# Patient Record
Sex: Female | Born: 1950 | Race: White | Hispanic: No | Marital: Married | Smoking: Never smoker
Health system: Southern US, Community
[De-identification: ages and names within clinical notes are randomized; demographics above are authoritative.]

## PROBLEM LIST (undated history)

## (undated) DIAGNOSIS — G40909 Epilepsy, unspecified, not intractable, without status epilepticus: Secondary | ICD-10-CM

## (undated) DIAGNOSIS — D649 Anemia, unspecified: Secondary | ICD-10-CM

## (undated) DIAGNOSIS — R4701 Aphasia: Secondary | ICD-10-CM

## (undated) DIAGNOSIS — R32 Unspecified urinary incontinence: Secondary | ICD-10-CM

## (undated) DIAGNOSIS — G8190 Hemiplegia, unspecified affecting unspecified side: Secondary | ICD-10-CM

## (undated) DIAGNOSIS — E039 Hypothyroidism, unspecified: Secondary | ICD-10-CM

## (undated) DIAGNOSIS — F32A Depression, unspecified: Secondary | ICD-10-CM

## (undated) DIAGNOSIS — E785 Hyperlipidemia, unspecified: Secondary | ICD-10-CM

## (undated) DIAGNOSIS — F0390 Unspecified dementia without behavioral disturbance: Secondary | ICD-10-CM

## (undated) DIAGNOSIS — E079 Disorder of thyroid, unspecified: Secondary | ICD-10-CM

## (undated) DIAGNOSIS — I10 Essential (primary) hypertension: Secondary | ICD-10-CM

## (undated) DIAGNOSIS — I639 Cerebral infarction, unspecified: Secondary | ICD-10-CM

## (undated) HISTORY — PX: THYROID SURGERY: SHX805

---

## 2013-09-22 DIAGNOSIS — K5090 Crohn's disease, unspecified, without complications: Secondary | ICD-10-CM | POA: Diagnosis not present

## 2013-09-22 DIAGNOSIS — N183 Chronic kidney disease, stage 3 unspecified: Secondary | ICD-10-CM | POA: Diagnosis not present

## 2013-09-22 DIAGNOSIS — E039 Hypothyroidism, unspecified: Secondary | ICD-10-CM | POA: Diagnosis not present

## 2013-12-29 DIAGNOSIS — K5010 Crohn's disease of large intestine without complications: Secondary | ICD-10-CM | POA: Diagnosis not present

## 2014-01-22 DIAGNOSIS — Z86010 Personal history of colonic polyps: Secondary | ICD-10-CM | POA: Diagnosis not present

## 2014-01-22 DIAGNOSIS — K5010 Crohn's disease of large intestine without complications: Secondary | ICD-10-CM | POA: Diagnosis not present

## 2014-02-02 DIAGNOSIS — E039 Hypothyroidism, unspecified: Secondary | ICD-10-CM | POA: Diagnosis not present

## 2014-02-02 DIAGNOSIS — Z79899 Other long term (current) drug therapy: Secondary | ICD-10-CM | POA: Diagnosis not present

## 2014-02-02 DIAGNOSIS — Z8673 Personal history of transient ischemic attack (TIA), and cerebral infarction without residual deficits: Secondary | ICD-10-CM | POA: Diagnosis not present

## 2014-02-02 DIAGNOSIS — N183 Chronic kidney disease, stage 3 unspecified: Secondary | ICD-10-CM | POA: Diagnosis not present

## 2014-02-02 DIAGNOSIS — F341 Dysthymic disorder: Secondary | ICD-10-CM | POA: Diagnosis not present

## 2014-02-02 DIAGNOSIS — E785 Hyperlipidemia, unspecified: Secondary | ICD-10-CM | POA: Diagnosis not present

## 2014-02-11 DIAGNOSIS — Z1231 Encounter for screening mammogram for malignant neoplasm of breast: Secondary | ICD-10-CM | POA: Diagnosis not present

## 2014-03-01 DIAGNOSIS — R748 Abnormal levels of other serum enzymes: Secondary | ICD-10-CM | POA: Diagnosis not present

## 2014-03-23 DIAGNOSIS — R748 Abnormal levels of other serum enzymes: Secondary | ICD-10-CM | POA: Diagnosis not present

## 2014-03-31 DIAGNOSIS — R748 Abnormal levels of other serum enzymes: Secondary | ICD-10-CM | POA: Diagnosis not present

## 2014-04-03 DIAGNOSIS — R748 Abnormal levels of other serum enzymes: Secondary | ICD-10-CM | POA: Diagnosis not present

## 2014-04-03 DIAGNOSIS — K8020 Calculus of gallbladder without cholecystitis without obstruction: Secondary | ICD-10-CM | POA: Diagnosis not present

## 2014-04-08 DIAGNOSIS — Z8673 Personal history of transient ischemic attack (TIA), and cerebral infarction without residual deficits: Secondary | ICD-10-CM | POA: Diagnosis not present

## 2014-04-08 DIAGNOSIS — K5090 Crohn's disease, unspecified, without complications: Secondary | ICD-10-CM | POA: Diagnosis not present

## 2014-04-08 DIAGNOSIS — E785 Hyperlipidemia, unspecified: Secondary | ICD-10-CM | POA: Diagnosis not present

## 2014-04-08 DIAGNOSIS — R748 Abnormal levels of other serum enzymes: Secondary | ICD-10-CM | POA: Diagnosis not present

## 2014-05-07 DIAGNOSIS — E785 Hyperlipidemia, unspecified: Secondary | ICD-10-CM | POA: Diagnosis not present

## 2014-05-07 DIAGNOSIS — R748 Abnormal levels of other serum enzymes: Secondary | ICD-10-CM | POA: Diagnosis not present

## 2014-07-01 DIAGNOSIS — H1130 Conjunctival hemorrhage, unspecified eye: Secondary | ICD-10-CM | POA: Diagnosis not present

## 2014-07-01 DIAGNOSIS — H1132 Conjunctival hemorrhage, left eye: Secondary | ICD-10-CM | POA: Diagnosis not present

## 2014-07-23 DIAGNOSIS — I6930 Unspecified sequelae of cerebral infarction: Secondary | ICD-10-CM | POA: Diagnosis not present

## 2014-07-23 DIAGNOSIS — Z79899 Other long term (current) drug therapy: Secondary | ICD-10-CM | POA: Diagnosis not present

## 2014-07-23 DIAGNOSIS — E039 Hypothyroidism, unspecified: Secondary | ICD-10-CM | POA: Diagnosis not present

## 2014-07-23 DIAGNOSIS — E780 Pure hypercholesterolemia: Secondary | ICD-10-CM | POA: Diagnosis not present

## 2014-08-23 DIAGNOSIS — G40209 Localization-related (focal) (partial) symptomatic epilepsy and epileptic syndromes with complex partial seizures, not intractable, without status epilepticus: Secondary | ICD-10-CM | POA: Diagnosis not present

## 2014-09-24 DIAGNOSIS — L02439 Carbuncle of limb, unspecified: Secondary | ICD-10-CM | POA: Diagnosis not present

## 2014-09-27 DIAGNOSIS — Z6831 Body mass index (BMI) 31.0-31.9, adult: Secondary | ICD-10-CM | POA: Diagnosis not present

## 2014-09-27 DIAGNOSIS — L02415 Cutaneous abscess of right lower limb: Secondary | ICD-10-CM | POA: Diagnosis not present

## 2014-09-27 DIAGNOSIS — E669 Obesity, unspecified: Secondary | ICD-10-CM | POA: Diagnosis not present

## 2014-11-01 DIAGNOSIS — L720 Epidermal cyst: Secondary | ICD-10-CM | POA: Diagnosis not present

## 2014-11-01 DIAGNOSIS — L723 Sebaceous cyst: Secondary | ICD-10-CM | POA: Diagnosis not present

## 2014-11-15 DIAGNOSIS — L723 Sebaceous cyst: Secondary | ICD-10-CM | POA: Diagnosis not present

## 2015-03-11 DIAGNOSIS — K5010 Crohn's disease of large intestine without complications: Secondary | ICD-10-CM | POA: Diagnosis not present

## 2015-03-11 DIAGNOSIS — I6930 Unspecified sequelae of cerebral infarction: Secondary | ICD-10-CM | POA: Diagnosis not present

## 2015-03-11 DIAGNOSIS — Z79899 Other long term (current) drug therapy: Secondary | ICD-10-CM | POA: Diagnosis not present

## 2015-03-11 DIAGNOSIS — E039 Hypothyroidism, unspecified: Secondary | ICD-10-CM | POA: Diagnosis not present

## 2015-03-11 DIAGNOSIS — E780 Pure hypercholesterolemia: Secondary | ICD-10-CM | POA: Diagnosis not present

## 2015-08-11 DIAGNOSIS — G40209 Localization-related (focal) (partial) symptomatic epilepsy and epileptic syndromes with complex partial seizures, not intractable, without status epilepticus: Secondary | ICD-10-CM | POA: Diagnosis not present

## 2015-10-07 DIAGNOSIS — Z79899 Other long term (current) drug therapy: Secondary | ICD-10-CM | POA: Diagnosis not present

## 2015-10-07 DIAGNOSIS — E785 Hyperlipidemia, unspecified: Secondary | ICD-10-CM | POA: Diagnosis not present

## 2015-10-07 DIAGNOSIS — Z9181 History of falling: Secondary | ICD-10-CM | POA: Diagnosis not present

## 2015-10-07 DIAGNOSIS — E039 Hypothyroidism, unspecified: Secondary | ICD-10-CM | POA: Diagnosis not present

## 2016-04-05 DIAGNOSIS — E785 Hyperlipidemia, unspecified: Secondary | ICD-10-CM | POA: Diagnosis not present

## 2016-04-05 DIAGNOSIS — E039 Hypothyroidism, unspecified: Secondary | ICD-10-CM | POA: Diagnosis not present

## 2016-04-05 DIAGNOSIS — K5090 Crohn's disease, unspecified, without complications: Secondary | ICD-10-CM | POA: Diagnosis not present

## 2016-04-05 DIAGNOSIS — I6930 Unspecified sequelae of cerebral infarction: Secondary | ICD-10-CM | POA: Diagnosis not present

## 2016-04-05 DIAGNOSIS — Z79899 Other long term (current) drug therapy: Secondary | ICD-10-CM | POA: Diagnosis not present

## 2016-08-06 DIAGNOSIS — G40209 Localization-related (focal) (partial) symptomatic epilepsy and epileptic syndromes with complex partial seizures, not intractable, without status epilepticus: Secondary | ICD-10-CM | POA: Diagnosis not present

## 2017-02-01 DIAGNOSIS — K5090 Crohn's disease, unspecified, without complications: Secondary | ICD-10-CM | POA: Diagnosis not present

## 2017-02-01 DIAGNOSIS — E785 Hyperlipidemia, unspecified: Secondary | ICD-10-CM | POA: Diagnosis not present

## 2017-02-01 DIAGNOSIS — Z79899 Other long term (current) drug therapy: Secondary | ICD-10-CM | POA: Diagnosis not present

## 2017-02-01 DIAGNOSIS — E039 Hypothyroidism, unspecified: Secondary | ICD-10-CM | POA: Diagnosis not present

## 2017-02-01 DIAGNOSIS — I6930 Unspecified sequelae of cerebral infarction: Secondary | ICD-10-CM | POA: Diagnosis not present

## 2017-02-14 DIAGNOSIS — Z111 Encounter for screening for respiratory tuberculosis: Secondary | ICD-10-CM | POA: Diagnosis not present

## 2017-02-14 DIAGNOSIS — Z23 Encounter for immunization: Secondary | ICD-10-CM | POA: Diagnosis not present

## 2017-02-14 DIAGNOSIS — Z209 Contact with and (suspected) exposure to unspecified communicable disease: Secondary | ICD-10-CM | POA: Diagnosis not present

## 2017-02-28 DIAGNOSIS — I6930 Unspecified sequelae of cerebral infarction: Secondary | ICD-10-CM | POA: Diagnosis not present

## 2017-07-15 DIAGNOSIS — H02831 Dermatochalasis of right upper eyelid: Secondary | ICD-10-CM | POA: Diagnosis not present

## 2017-07-15 DIAGNOSIS — R4701 Aphasia: Secondary | ICD-10-CM | POA: Diagnosis not present

## 2017-07-15 DIAGNOSIS — H2513 Age-related nuclear cataract, bilateral: Secondary | ICD-10-CM | POA: Diagnosis not present

## 2017-07-15 DIAGNOSIS — H47012 Ischemic optic neuropathy, left eye: Secondary | ICD-10-CM | POA: Diagnosis not present

## 2017-08-17 DIAGNOSIS — L03011 Cellulitis of right finger: Secondary | ICD-10-CM | POA: Diagnosis not present

## 2017-10-08 DIAGNOSIS — E785 Hyperlipidemia, unspecified: Secondary | ICD-10-CM | POA: Diagnosis not present

## 2017-10-08 DIAGNOSIS — E039 Hypothyroidism, unspecified: Secondary | ICD-10-CM | POA: Diagnosis not present

## 2017-10-08 DIAGNOSIS — Z0000 Encounter for general adult medical examination without abnormal findings: Secondary | ICD-10-CM | POA: Diagnosis not present

## 2017-10-08 DIAGNOSIS — Z8673 Personal history of transient ischemic attack (TIA), and cerebral infarction without residual deficits: Secondary | ICD-10-CM | POA: Diagnosis not present

## 2017-10-08 DIAGNOSIS — G40209 Localization-related (focal) (partial) symptomatic epilepsy and epileptic syndromes with complex partial seizures, not intractable, without status epilepticus: Secondary | ICD-10-CM | POA: Diagnosis not present

## 2017-10-08 DIAGNOSIS — Z79899 Other long term (current) drug therapy: Secondary | ICD-10-CM | POA: Diagnosis not present

## 2017-12-20 DIAGNOSIS — G40209 Localization-related (focal) (partial) symptomatic epilepsy and epileptic syndromes with complex partial seizures, not intractable, without status epilepticus: Secondary | ICD-10-CM | POA: Diagnosis not present

## 2017-12-20 DIAGNOSIS — M7051 Other bursitis of knee, right knee: Secondary | ICD-10-CM | POA: Diagnosis not present

## 2017-12-20 DIAGNOSIS — I69359 Hemiplegia and hemiparesis following cerebral infarction affecting unspecified side: Secondary | ICD-10-CM | POA: Diagnosis not present

## 2017-12-20 DIAGNOSIS — M7052 Other bursitis of knee, left knee: Secondary | ICD-10-CM | POA: Diagnosis not present

## 2018-02-22 DIAGNOSIS — L089 Local infection of the skin and subcutaneous tissue, unspecified: Secondary | ICD-10-CM | POA: Diagnosis not present

## 2018-03-31 DIAGNOSIS — K5000 Crohn's disease of small intestine without complications: Secondary | ICD-10-CM | POA: Diagnosis not present

## 2018-04-11 DIAGNOSIS — Z79899 Other long term (current) drug therapy: Secondary | ICD-10-CM | POA: Diagnosis not present

## 2018-04-11 DIAGNOSIS — Z6830 Body mass index (BMI) 30.0-30.9, adult: Secondary | ICD-10-CM | POA: Diagnosis not present

## 2018-04-11 DIAGNOSIS — Z9181 History of falling: Secondary | ICD-10-CM | POA: Diagnosis not present

## 2018-04-11 DIAGNOSIS — Z1331 Encounter for screening for depression: Secondary | ICD-10-CM | POA: Diagnosis not present

## 2018-04-11 DIAGNOSIS — E039 Hypothyroidism, unspecified: Secondary | ICD-10-CM | POA: Diagnosis not present

## 2018-07-11 DIAGNOSIS — S61419A Laceration without foreign body of unspecified hand, initial encounter: Secondary | ICD-10-CM | POA: Diagnosis not present

## 2018-07-11 DIAGNOSIS — Z6832 Body mass index (BMI) 32.0-32.9, adult: Secondary | ICD-10-CM | POA: Diagnosis not present

## 2018-08-11 ENCOUNTER — Emergency Department (HOSPITAL_BASED_OUTPATIENT_CLINIC_OR_DEPARTMENT_OTHER): Payer: Medicare Other

## 2018-08-11 ENCOUNTER — Other Ambulatory Visit: Payer: Self-pay

## 2018-08-11 ENCOUNTER — Encounter (HOSPITAL_BASED_OUTPATIENT_CLINIC_OR_DEPARTMENT_OTHER): Payer: Self-pay | Admitting: *Deleted

## 2018-08-11 ENCOUNTER — Emergency Department (HOSPITAL_BASED_OUTPATIENT_CLINIC_OR_DEPARTMENT_OTHER)
Admission: EM | Admit: 2018-08-11 | Discharge: 2018-08-11 | Disposition: A | Discharge status: Home / Self Care | Payer: Medicare Other | Attending: Emergency Medicine | Admitting: Emergency Medicine

## 2018-08-11 DIAGNOSIS — Y929 Unspecified place or not applicable: Secondary | ICD-10-CM | POA: Insufficient documentation

## 2018-08-11 DIAGNOSIS — Z79899 Other long term (current) drug therapy: Secondary | ICD-10-CM | POA: Insufficient documentation

## 2018-08-11 DIAGNOSIS — Y9389 Activity, other specified: Secondary | ICD-10-CM | POA: Insufficient documentation

## 2018-08-11 DIAGNOSIS — Z87891 Personal history of nicotine dependence: Secondary | ICD-10-CM | POA: Insufficient documentation

## 2018-08-11 DIAGNOSIS — Z7902 Long term (current) use of antithrombotics/antiplatelets: Secondary | ICD-10-CM | POA: Diagnosis not present

## 2018-08-11 DIAGNOSIS — S199XXA Unspecified injury of neck, initial encounter: Secondary | ICD-10-CM | POA: Diagnosis not present

## 2018-08-11 DIAGNOSIS — Z7982 Long term (current) use of aspirin: Secondary | ICD-10-CM | POA: Diagnosis not present

## 2018-08-11 DIAGNOSIS — I10 Essential (primary) hypertension: Secondary | ICD-10-CM | POA: Diagnosis not present

## 2018-08-11 DIAGNOSIS — Y998 Other external cause status: Secondary | ICD-10-CM | POA: Diagnosis not present

## 2018-08-11 DIAGNOSIS — S0990XA Unspecified injury of head, initial encounter: Secondary | ICD-10-CM | POA: Diagnosis not present

## 2018-08-11 DIAGNOSIS — Z8673 Personal history of transient ischemic attack (TIA), and cerebral infarction without residual deficits: Secondary | ICD-10-CM | POA: Insufficient documentation

## 2018-08-11 DIAGNOSIS — M79601 Pain in right arm: Secondary | ICD-10-CM | POA: Diagnosis not present

## 2018-08-11 DIAGNOSIS — S5011XA Contusion of right forearm, initial encounter: Secondary | ICD-10-CM | POA: Diagnosis not present

## 2018-08-11 DIAGNOSIS — S4991XA Unspecified injury of right shoulder and upper arm, initial encounter: Secondary | ICD-10-CM | POA: Diagnosis not present

## 2018-08-11 DIAGNOSIS — W0110XA Fall on same level from slipping, tripping and stumbling with subsequent striking against unspecified object, initial encounter: Secondary | ICD-10-CM | POA: Diagnosis not present

## 2018-08-11 DIAGNOSIS — W19XXXA Unspecified fall, initial encounter: Secondary | ICD-10-CM

## 2018-08-11 HISTORY — DX: Essential (primary) hypertension: I10

## 2018-08-11 HISTORY — DX: Cerebral infarction, unspecified: I63.9

## 2018-08-11 HISTORY — DX: Disorder of thyroid, unspecified: E07.9

## 2018-08-11 NOTE — ED Triage Notes (Signed)
Hx of stroke 2008. She had an unwitnessed fall tonight. She has a skin tear and hematoma to her right forearm. She has a hematoma to her left scalp. She is on blood thinners.

## 2018-08-11 NOTE — Discharge Instructions (Signed)
Please read and follow all provided instructions.  Your diagnoses today include:  1. Fall, initial encounter   2. Minor head injury, initial encounter   3. Traumatic hematoma of right forearm, initial encounter     Tests performed today include:  CT scan of your head that did not show any serious injury.  X-ray of the R arm did not show any broken bones -- shows hematoma  Vital signs. See below for your results today.   Medications prescribed:   None  Take any prescribed medications only as directed.  Home care instructions:  Follow any educational materials contained in this packet.  Follow-up instructions: Please follow-up with your primary care provider in the next 3 days for further evaluation of your symptoms.   Return instructions:  SEEK IMMEDIATE MEDICAL ATTENTION IF:  There is confusion or drowsiness (although children frequently become drowsy after injury).   You cannot awaken the injured person.   You have more than one episode of vomiting.   You notice dizziness or unsteadiness which is getting worse, or inability to walk.   You have convulsions or unconsciousness.   You experience severe, persistent headaches not relieved by Tylenol.  You cannot use arms or legs normally.   There are changes in pupil sizes. (This is the black center in the colored part of the eye)   There is clear or bloody discharge from the nose or ears.   You have change in speech, vision, swallowing, or understanding.   Localized weakness, numbness, tingling, or change in bowel or bladder control.  You have any other emergent concerns.  Additional Information: You have had a head injury which does not appear to require admission at this time.  Your vital signs today were: BP (!) 171/77    Pulse 64    Temp (!) 97.5 F (36.4 C) (Oral)    Resp 18    Ht 5\' 4"  (1.626 m)    Wt 93 kg    SpO2 100%    BMI 35.19 kg/m  If your blood pressure (BP) was elevated above 135/85 this visit,  please have this repeated by your doctor within one month. --------------

## 2018-08-11 NOTE — ED Notes (Signed)
Patient transported to CT 

## 2018-08-11 NOTE — ED Provider Notes (Addendum)
[facility] EMERGENCY DEPARTMENT Provider Note   CSN: [id number] Arrival date & time: 08/11/18  1941     History   Chief Complaint Chief Complaint  Patient presents with  . Fall    HPI Suzanne Barnett is a 67 y.o. female.  Patient who is a nursing home resident, nonverbal due to history of stroke, on Plavix and aspirin --presents the emergency department.  Patient had an unwitnessed fall tonight at approximately 6:30 PM.  Patient was able to crawl and call for help after her fall.  Patient developed a large hematoma over her right forearm.  She hit her head and sustained a scalp hematoma.  No reported neck pain.  No chest or abdominal pain.  Patient normally walks with a quad cane and has been on her feet ambulating at her baseline since the fall.  Patient has a skin tear to the right hand from a previous fall.  No vomiting reported.  Patient is at her mental baseline.     Past Medical History:  Diagnosis Date  . Hypertension   . Stroke (HCC)   . Thyroid disease     There are no active problems to display for this patient.   Past Surgical History:  Procedure Laterality Date  . CESAREAN SECTION    . THYROID SURGERY       OB History   None      Home Medications    Prior to Admission medications   Medication Sig Start Date End Date Taking? Authorizing Provider  aspirin 81 MG tablet Take 81 mg by mouth daily.   Yes [provider]  baclofen (LIORESAL) 10 MG tablet Take 10 mg by mouth 3 (three) times daily.   Yes [provider]  calcium carbonate (CALCIUM 600) 600 MG TABS tablet Take 600 mg by mouth 2 (two) times daily with a meal.   Yes [provider]  citalopram (CELEXA) 40 MG tablet Take 40 mg by mouth daily.   Yes [provider]  clopidogrel (PLAVIX) 75 MG tablet Take 75 mg by mouth daily.   Yes [provider]  levETIRAcetam (KEPPRA) 500 MG tablet Take 500 mg by mouth 2 (two) times daily.   Yes [provider]  Levothyroxine Sodium (SYNTHROID PO) Take by mouth.   Yes [provider]  mesalamine (LIALDA) 1.2 g EC tablet Take 1.2 g by mouth daily with breakfast.   Yes [provider]  simvastatin (ZOCOR) 20 MG tablet Take 20 mg by mouth daily.   Yes [provider]    Family History No family history on file.  Social History Social History   Tobacco Use  . Smoking status: Former Games developer  . Smokeless tobacco: Never Used  Substance Use Topics  . Alcohol use: Not Currently    Frequency: Never  . Drug use: Never     Allergies   Tape   Review of Systems Review of Systems  Constitutional: Negative for fever.  HENT: Negative for rhinorrhea and sinus pressure.   Eyes: Negative for discharge and redness.  Respiratory: Negative for shortness of breath.   Cardiovascular: Negative for chest pain.  Gastrointestinal: Negative for nausea and vomiting.  Musculoskeletal: Positive for arthralgias and myalgias. Negative for gait problem, joint swelling, neck pain and neck stiffness.  Skin: Negative for rash.  Neurological: Positive for speech difficulty (Chronic). Negative for weakness, light-headedness and headaches.  Psychiatric/Behavioral: Negative for confusion.     Physical Exam Updated Vital Signs BP (!) 171/77  Pulse 64   Temp (!) 97.5 F (36.4 C) (Oral)   Resp 18   Ht 5\' 4"  (1.626 m)   Wt 93 kg   SpO2 100%   BMI 35.19 kg/m   Physical Exam  Constitutional: She appears well-developed and well-nourished.  HENT:  Head: Normocephalic and atraumatic.  Right Ear: Tympanic membrane, external ear and ear canal normal.  Left Ear: Tympanic membrane, external ear and ear canal normal.  Nose: Nose normal.  Mouth/Throat: Uvula is midline, oropharynx is clear and moist and mucous membranes are normal.  Eyes: Pupils are equal, round, and reactive to light. Conjunctivae, EOM and lids are normal. Right eye exhibits no nystagmus. Left eye exhibits no  nystagmus.  Neck: Normal range of motion. Neck supple.  Cardiovascular: Normal rate.  Pulses:      Radial pulses are 2+ on the right side, and 2+ on the left side.  Pulmonary/Chest: Effort normal and breath sounds normal.  Abdominal: Soft. There is no tenderness.  Musculoskeletal:       Right shoulder: Normal.       Right elbow: Normal.      Right wrist: Normal.       Cervical back: She exhibits normal range of motion, no tenderness and no bony tenderness.       Right upper arm: She exhibits tenderness. She exhibits no bony tenderness and no swelling.       Right forearm: She exhibits tenderness and swelling.       Arms: Neurological: She is alert. She has normal strength and normal reflexes. GCS eye subscore is 4. GCS verbal subscore is 5. GCS motor subscore is 6.  Patient with not comprehensive speech due to previous stroke.  Patient moves all extremities purposefully during exam.  Skin: Skin is warm and dry.  Cap refill in R hand less than 2 seconds.   Psychiatric: She has a normal mood and affect.  Nursing note and vitals reviewed.    ED Treatments / Results  Labs (all labs ordered are listed, but only abnormal results are displayed) Labs Reviewed - No data to display  EKG None  Radiology Dg Forearm Right  Result Date: 08/11/2018 CLINICAL DATA:  Right forearm hematoma after fall. EXAM: RIGHT FOREARM - 2 VIEW COMPARISON:  None. FINDINGS: There is no evidence of fracture or other focal bone lesions. Large probable hematoma is seen involving the dorsal soft tissues of the proximal forearm. IMPRESSION: No fracture or dislocation is noted. Large dorsal soft tissue hematoma is seen in proximal forearm. Electronically Signed   By: Lupita Raider, M.D.   On: 08/11/2018 21:09   Ct Head Wo Contrast  Result Date: 08/11/2018 CLINICAL DATA:  Head injury after unwitnessed fall. EXAM: CT HEAD WITHOUT CONTRAST CT CERVICAL SPINE WITHOUT CONTRAST TECHNIQUE: Multidetector CT imaging of the  head and cervical spine was performed following the standard protocol without intravenous contrast. Multiplanar CT image reconstructions of the cervical spine were also generated. COMPARISON:  None. FINDINGS: CT HEAD FINDINGS Brain: Mild diffuse cortical atrophy is noted. Stable left frontal encephalomalacia is noted. No mass effect or midline shift is noted. Ventricular size is within normal limits. There is no evidence of mass lesion, hemorrhage or acute infarction. Vascular: No hyperdense vessel or unexpected calcification. Skull: Normal. Negative for fracture or focal lesion. Sinuses/Orbits: No acute finding. Other: None. CT CERVICAL SPINE FINDINGS Alignment: Normal. Skull base and vertebrae: No acute fracture. No primary bone lesion or focal pathologic process. Soft tissues and spinal canal:  No prevertebral fluid or swelling. No visible canal hematoma. Disc levels: Mild degenerative disc disease is noted at C5-6 and C6-7 with anterior osteophyte formation. Upper chest: Negative. Other: None. IMPRESSION: Mild diffuse cortical atrophy. Stable left frontal encephalomalacia. No acute intracranial abnormality seen. Mild multilevel degenerative disc disease. No acute abnormality seen in the cervical spine. Electronically Signed   By: Lupita Raider, M.D.   On: 08/11/2018 21:19   Ct Cervical Spine Wo Contrast  Result Date: 08/11/2018 CLINICAL DATA:  Head injury after unwitnessed fall. EXAM: CT HEAD WITHOUT CONTRAST CT CERVICAL SPINE WITHOUT CONTRAST TECHNIQUE: Multidetector CT imaging of the head and cervical spine was performed following the standard protocol without intravenous contrast. Multiplanar CT image reconstructions of the cervical spine were also generated. COMPARISON:  None. FINDINGS: CT HEAD FINDINGS Brain: Mild diffuse cortical atrophy is noted. Stable left frontal encephalomalacia is noted. No mass effect or midline shift is noted. Ventricular size is within normal limits. There is no evidence of  mass lesion, hemorrhage or acute infarction. Vascular: No hyperdense vessel or unexpected calcification. Skull: Normal. Negative for fracture or focal lesion. Sinuses/Orbits: No acute finding. Other: None. CT CERVICAL SPINE FINDINGS Alignment: Normal. Skull base and vertebrae: No acute fracture. No primary bone lesion or focal pathologic process. Soft tissues and spinal canal: No prevertebral fluid or swelling. No visible canal hematoma. Disc levels: Mild degenerative disc disease is noted at C5-6 and C6-7 with anterior osteophyte formation. Upper chest: Negative. Other: None. IMPRESSION: Mild diffuse cortical atrophy. Stable left frontal encephalomalacia. No acute intracranial abnormality seen. Mild multilevel degenerative disc disease. No acute abnormality seen in the cervical spine. Electronically Signed   By: Lupita Raider, M.D.   On: 08/11/2018 21:19   Dg Humerus Right  Result Date: 08/11/2018 CLINICAL DATA:  Right arm pain after unwitnessed fall. EXAM: RIGHT HUMERUS - 2+ VIEW COMPARISON:  None. FINDINGS: There is no evidence of fracture or other focal bone lesions. Soft tissues are unremarkable. IMPRESSION: Negative. Electronically Signed   By: Lupita Raider, M.D.   On: 08/11/2018 21:42    Procedures Procedures (including critical care time)  Medications Ordered in ED Medications - No data to display   Initial Impression / Assessment and Plan / ED Course  I have reviewed the triage vital signs and the nursing notes.  Pertinent labs & imaging results that were available during my care of the patient were reviewed by me and considered in my medical decision making (see chart for details).     Patient seen and examined. Work-up initiated.   Vital signs reviewed and are as follows: BP (!) 171/77   Pulse 64   Temp (!) 97.5 F (36.4 C) (Oral)   Resp 18   Ht 5\' 4"  (1.626 m)   Wt 93 kg   SpO2 100%   BMI 35.19 kg/m   Patient discussed with and seen by Dr. Madilyn Hook.  Patient and  caregiver updated on results.  Hematoma rechecked.  No substantial enlargement.  No signs of compartment syndrome.  Discussed continuing cool compress on the area and monitoring for any changes.  Discussed with caregiver that if the fingers or hand become cool or pale or if the forearm itself becomes very hard, the patient should be seen immediately in the emergency department for reevaluation.  Otherwise, follow with PCP as needed.  Caregiver will be able to monitor the area closely over the next few days.  Final Clinical Impressions(s) / ED Diagnoses   Final diagnoses:  Fall, initial encounter  Minor head injury, initial encounter  Traumatic hematoma of right forearm, initial encounter   Patient with mechanical fall.  She has a superficial traumatic hematoma of the right forearm.  Imaging is otherwise negative.  Hematoma appears stable here.  Compartments of the forearm are soft.  No signs of vascular compromise to the hand or fingers.  Patient has good monitoring at home.  No indications for admission or monitoring here in the emergency department at this point.  Patient appears well and at her baseline.   ED Discharge Orders    None         Renne Crigler, Cordelia Poche 08/11/18 2222    Tilden Fossa, MD 08/14/18 [phone]

## 2018-08-11 NOTE — ED Notes (Signed)
Updated to plan of care

## 2018-08-11 NOTE — ED Notes (Signed)
Pt and daughter verbalize understanding of dc instructions and deny any further needs at this time 

## 2018-08-11 NOTE — ED Notes (Signed)
Patient returned from radiology

## 2018-10-13 DIAGNOSIS — Z79899 Other long term (current) drug therapy: Secondary | ICD-10-CM | POA: Diagnosis not present

## 2018-10-13 DIAGNOSIS — Z1331 Encounter for screening for depression: Secondary | ICD-10-CM | POA: Diagnosis not present

## 2018-10-13 DIAGNOSIS — Z0000 Encounter for general adult medical examination without abnormal findings: Secondary | ICD-10-CM | POA: Diagnosis not present

## 2018-10-13 DIAGNOSIS — Z6832 Body mass index (BMI) 32.0-32.9, adult: Secondary | ICD-10-CM | POA: Diagnosis not present

## 2018-10-13 DIAGNOSIS — E039 Hypothyroidism, unspecified: Secondary | ICD-10-CM | POA: Diagnosis not present

## 2018-10-13 DIAGNOSIS — E785 Hyperlipidemia, unspecified: Secondary | ICD-10-CM | POA: Diagnosis not present

## 2018-10-20 DIAGNOSIS — J329 Chronic sinusitis, unspecified: Secondary | ICD-10-CM | POA: Diagnosis not present

## 2018-10-20 DIAGNOSIS — J40 Bronchitis, not specified as acute or chronic: Secondary | ICD-10-CM | POA: Diagnosis not present

## 2018-10-20 DIAGNOSIS — Z6832 Body mass index (BMI) 32.0-32.9, adult: Secondary | ICD-10-CM | POA: Diagnosis not present

## 2019-04-11 ENCOUNTER — Emergency Department (HOSPITAL_BASED_OUTPATIENT_CLINIC_OR_DEPARTMENT_OTHER): Payer: Medicare Other

## 2019-04-11 ENCOUNTER — Other Ambulatory Visit: Payer: Self-pay

## 2019-04-11 ENCOUNTER — Emergency Department (HOSPITAL_BASED_OUTPATIENT_CLINIC_OR_DEPARTMENT_OTHER)
Admission: EM | Admit: 2019-04-11 | Discharge: 2019-04-11 | Disposition: A | Discharge status: Home / Self Care | Payer: Medicare Other | Attending: Emergency Medicine | Admitting: Emergency Medicine

## 2019-04-11 ENCOUNTER — Encounter (HOSPITAL_BASED_OUTPATIENT_CLINIC_OR_DEPARTMENT_OTHER): Payer: Self-pay | Admitting: Emergency Medicine

## 2019-04-11 DIAGNOSIS — Z7982 Long term (current) use of aspirin: Secondary | ICD-10-CM | POA: Diagnosis not present

## 2019-04-11 DIAGNOSIS — Z87891 Personal history of nicotine dependence: Secondary | ICD-10-CM | POA: Diagnosis not present

## 2019-04-11 DIAGNOSIS — R51 Headache: Secondary | ICD-10-CM | POA: Insufficient documentation

## 2019-04-11 DIAGNOSIS — I10 Essential (primary) hypertension: Secondary | ICD-10-CM | POA: Insufficient documentation

## 2019-04-11 DIAGNOSIS — Z8673 Personal history of transient ischemic attack (TIA), and cerebral infarction without residual deficits: Secondary | ICD-10-CM | POA: Insufficient documentation

## 2019-04-11 DIAGNOSIS — I6523 Occlusion and stenosis of bilateral carotid arteries: Secondary | ICD-10-CM | POA: Diagnosis not present

## 2019-04-11 DIAGNOSIS — Z79899 Other long term (current) drug therapy: Secondary | ICD-10-CM | POA: Diagnosis not present

## 2019-04-11 DIAGNOSIS — R519 Headache, unspecified: Secondary | ICD-10-CM

## 2019-04-11 DIAGNOSIS — I700 Atherosclerosis of aorta: Secondary | ICD-10-CM | POA: Diagnosis not present

## 2019-04-11 LAB — CBC WITH DIFFERENTIAL/PLATELET
Abs Immature Granulocytes: 0.02 10*3/uL (ref 0.00–0.07)
Basophils Absolute: 0.1 10*3/uL (ref 0.0–0.1)
Basophils Relative: 1 %
Eosinophils Absolute: 0.2 10*3/uL (ref 0.0–0.5)
Eosinophils Relative: 2 %
HCT: 45.7 % (ref 36.0–46.0)
Hemoglobin: 14.8 g/dL (ref 12.0–15.0)
Immature Granulocytes: 0 %
Lymphocytes Relative: 28 %
Lymphs Abs: 2.6 10*3/uL (ref 0.7–4.0)
MCH: 29.5 pg (ref 26.0–34.0)
MCHC: 32.4 g/dL (ref 30.0–36.0)
MCV: 91 fL (ref 80.0–100.0)
Monocytes Absolute: 0.5 10*3/uL (ref 0.1–1.0)
Monocytes Relative: 6 %
Neutro Abs: 5.9 10*3/uL (ref 1.7–7.7)
Neutrophils Relative %: 63 %
Platelets: 308 10*3/uL (ref 150–400)
RBC: 5.02 MIL/uL (ref 3.87–5.11)
RDW: 12.3 % (ref 11.5–15.5)
WBC: 9.3 10*3/uL (ref 4.0–10.5)
nRBC: 0 % (ref 0.0–0.2)

## 2019-04-11 LAB — COMPREHENSIVE METABOLIC PANEL
ALT: 45 U/L — ABNORMAL HIGH (ref 0–44)
AST: 32 U/L (ref 15–41)
Albumin: 4.3 g/dL (ref 3.5–5.0)
Alkaline Phosphatase: 96 U/L (ref 38–126)
Anion gap: 14 (ref 5–15)
BUN: 22 mg/dL (ref 8–23)
CO2: 24 mmol/L (ref 22–32)
Calcium: 9.1 mg/dL (ref 8.9–10.3)
Chloride: 97 mmol/L — ABNORMAL LOW (ref 98–111)
Creatinine, Ser: 0.93 mg/dL (ref 0.44–1.00)
GFR calc Af Amer: >60 mL/min (ref >60)
GFR calc non Af Amer: >60 mL/min (ref >60)
Glucose, Bld: 155 mg/dL — ABNORMAL HIGH (ref 70–99)
Potassium: 3.8 mmol/L (ref 3.5–5.1)
Sodium: 135 mmol/L (ref 135–145)
Total Bilirubin: 0.5 mg/dL (ref 0.3–1.2)
Total Protein: 8.1 g/dL (ref 6.5–8.1)

## 2019-04-11 LAB — SEDIMENTATION RATE: Sed Rate: 13 mm/hr (ref 0–22)

## 2019-04-11 MED ORDER — PROCHLORPERAZINE EDISYLATE 10 MG/2ML IJ SOLN
10.0000 mg | Freq: Once | INTRAMUSCULAR | Status: AC
  Administered 2019-04-11: 10 mg via INTRAVENOUS
  Filled 2019-04-11: qty 2

## 2019-04-11 MED ORDER — DIPHENHYDRAMINE HCL 50 MG/ML IJ SOLN
25.0000 mg | Freq: Once | INTRAMUSCULAR | Status: AC
  Administered 2019-04-11: 25 mg via INTRAVENOUS
  Filled 2019-04-11: qty 1

## 2019-04-11 MED ORDER — SODIUM CHLORIDE 0.9 % IV BOLUS
500.0000 mL | Freq: Once | INTRAVENOUS | Status: AC
  Administered 2019-04-11: 12:00:00 500 mL via INTRAVENOUS

## 2019-04-11 MED ORDER — IOHEXOL 350 MG/ML SOLN
100.0000 mL | Freq: Once | INTRAVENOUS | Status: AC | PRN
  Administered 2019-04-11: 100 mL via INTRAVENOUS

## 2019-04-11 NOTE — ED Provider Notes (Signed)
[facility] Provider Note   CSN: [id number] Arrival date & time: 04/11/19  1029    History   Chief Complaint Chief Complaint  Patient presents with   Headache    HPI Suzanne Barnett is a 68 y.o. female.     HPI   Sharp headache, left sided headache, pulsating headache Hx of seizures bu no known hx of headaches, has been on keppra without any since then  Left temporal headache present for days and worsening, has not had haeadache like this before. 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   Past Surgical History:  Procedure Laterality Date   CESAREAN SECTION     THYROID SURGERY       OB History   No obstetric history on file.      Home Medications    Prior to Admission medications   Medication Sig Start Date End Date Taking? Authorizing Provider  aspirin 81 MG tablet Take 81 mg by mouth daily.    [provider]  baclofen (LIORESAL) 10 MG tablet Take 10 mg by mouth 3 (three) times daily.    [provider]  calcium carbonate (CALCIUM 600) 600 MG TABS tablet Take 600 mg by mouth 2 (two) times daily with a meal.    [provider]  citalopram (CELEXA) 40 MG tablet Take 40 mg by mouth daily.    [provider]  clopidogrel (PLAVIX) 75 MG tablet Take 75 mg by mouth daily.    [provider]  levETIRAcetam (KEPPRA) 500 MG tablet Take 500 mg by mouth 2 (two) times daily.    [provider]  Levothyroxine Sodium (SYNTHROID PO) Take by mouth.     [provider]  mesalamine (LIALDA) 1.2 g EC tablet Take 1.2 g by mouth daily with breakfast.    [provider]  simvastatin (ZOCOR) 20 MG tablet Take 20 mg by mouth daily.    [provider]    Family History No family history on file.  Social History Social History   Tobacco Use   Smoking status: Former Smoker   Smokeless tobacco: Never Used  Substance Use Topics   Alcohol use: Not Currently    Frequency: Never   Drug use: Never     Allergies   Tape   Review of Systems Review of Systems  Constitutional: Negative for fever.  Eyes: Negative for visual disturbance (no change from baseline).  Respiratory: Negative for cough.   Cardiovascular: Negative for chest pain.  Gastrointestinal: Negative for abdominal pain, diarrhea, nausea and vomiting.  Genitourinary: Negative for enuresis.  Musculoskeletal: Positive for gait problem.  Skin: Negative for rash.  Neurological: Positive for dizziness and headaches. Speech difficulty: no change from baseline. Weakness: no change from baseline. Numbness: no change from baseline.     Physical Exam Updated Vital Signs BP 140/62    Pulse 66    Temp 98.4 F (36.9 C) (Oral)    Resp 20    Wt 90.2 kg    SpO2  99%    BMI 34.13 kg/m   Physical Exam Vitals signs and nursing note reviewed.  Constitutional:      General: She is not in acute distress.    Appearance: She is well-developed. She is not diaphoretic.  HENT:     Head: Normocephalic and atraumatic.     Comments: Left temporal tenderness Eyes:     Conjunctiva/sclera: Conjunctivae normal.  Neck:     Musculoskeletal: Normal range of motion.  Cardiovascular:     Rate and Rhythm: Normal rate and regular rhythm.     Heart sounds: Normal heart sounds. No murmur. No friction rub. No gallop.   Pulmonary:     Effort: Pulmonary effort is normal. No respiratory distress.     Breath sounds: Normal breath sounds. No wheezing or rales.  Abdominal:      General: There is no distension.     Palpations: Abdomen is soft.     Tenderness: There is no abdominal tenderness. There is no guarding.  Musculoskeletal:        General: No tenderness.  Skin:    General: Skin is warm and dry.     Findings: No erythema or rash.  Neurological:     Mental Status: She is alert and oriented to person, place, and time.     Comments: Chronic aphasia, right sided paralysis      ED Treatments / Results  Labs (all labs ordered are listed, but only abnormal results are displayed) Labs Reviewed  COMPREHENSIVE METABOLIC PANEL - Abnormal; Notable for the following components:      Result Value   Chloride 97 (*)    Glucose, Bld 155 (*)    ALT 45 (*)    All other components within normal limits  CBC WITH DIFFERENTIAL/PLATELET  SEDIMENTATION RATE    EKG None  Radiology Ct Angio Head W Or Wo Contrast  Result Date: 04/11/2019 CLINICAL DATA:  68 year old female with acute severe headache, worst headache of life. EXAM: CT ANGIOGRAPHY HEAD AND NECK TECHNIQUE: Multidetector CT imaging of the head and neck was performed using the standard protocol during bolus administration of intravenous contrast. Multiplanar CT image reconstructions and MIPs were obtained to evaluate the vascular anatomy. Carotid stenosis measurements (when applicable) are obtained utilizing NASCET criteria, using the distal internal carotid diameter as the denominator. CONTRAST:  136m OMNIPAQUE IOHEXOL 350 MG/ML SOLN COMPARISON:  Head CT without contrast 1103 hours today. FINDINGS: CTA NECK Skeleton: No acute osseous abnormality identified. Upper chest: Negative lung apices. No superior mediastinal lymphadenopathy. Visible central pulmonary arteries are patent. Other neck: Surgically absent thyroid with small surgical clips. The palatine tonsils appear surgically absent with lingual tonsil hypertrophy. Otherwise negative, no neck mass or lymphadenopathy. Chronic pronounced encephalomalacia in the  left MCA territory is noted on the earlier CT. Aortic arch: 3 vessel arch configuration with mild to moderate Calcified aortic atherosclerosis. Right carotid system: No brachiocephalic artery or right CCA origin stenosis. Mildly tortuous right CCA. At the right ICA origin there is posterior soft and calcified plaque resulting in stenosis numerically estimated at 55-60 % with respect to the distal vessel (series 9, image 77). No additional stenosis to the skull base. Left carotid system: No left CCA origin stenosis. Soft plaque proximal to the bifurcation without stenosis. The left ICA is occluded abruptly just beyond its origin on series 9, image 132, with calcification at the site of occlusion. No reconstitution in the neck. Vertebral arteries: No proximal right subclavian artery or right vertebral artery origin stenosis  despite plaque. There is calcified right V1 segment plaque with mild if any stenosis (series 8, image 228). The right vertebral artery is dominant and patent to the skull base without stenosis. No proximal left subclavian artery stenosis despite abundant calcified plaque. Normal left vertebral artery origin. The left vertebral is non dominant and patent to the skull base without stenosis. CTA HEAD Posterior circulation: Dominant right vertebral artery. New V4 segment stenosis. Patent vertebrobasilar junction. Normal right PICA origin. The left PICA may be dominant. Patent basilar artery without stenosis. Patent SCA and PCA origins. Small left posterior communicating artery, the right is diminutive or absent. The left PCA branches are within normal limits. There is mild irregularity in the distal right PCA. Anterior circulation: The right ICA siphon is patent with only mild calcified plaque and no stenosis. The right ICA terminus, right MCA and ACA origins are patent and normal. The left siphon is occluded until there is faint reconstitution at the left ICA terminus near the level of the left  posterior communicating artery. The left MCA and ACA origins are patent but irregular. The right A1 and anterior communicating arteries are normal. The left A1 is diminutive and irregular. The bilateral ACA A2 segments appear more symmetric. No ACA branch occlusion is identified, but there is mild to moderate bilateral ACA branch irregularity. The right M1 and right MCA bifurcation are patent without stenosis. The right MCA branches are patent with mild irregularity. The left M1 and left MCA bifurcation are patent but diminutive. Left MCA branches are highly diminutive. Venous sinuses: Patent. Anatomic variants: Dominant right vertebral artery. Review of the MIP images confirms the above findings IMPRESSION: 1. Negative for intracranial aneurysm or emergent large vessel occlusion. 2. 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 Patent but diminutive left MCA branches and left ACA A1 segment. 3. Cervical right ICA plaque with up to 60% stenosis at the right ICA origin. Mild irregularity of the right anterior circulation compatible with atherosclerosis. 4. Dominant right vertebral artery with calcified plaque but no significant stenosis. Mild irregularity of the posterior circulation compatible with atherosclerosis. 5. Aortic Atherosclerosis (ICD10-I70.0). Electronically Signed   By: Genevie Ann M.D.   On: 04/11/2019 13:32   Ct Head Wo Contrast  Result Date: 04/11/2019 CLINICAL DATA:  Acute and severe headache EXAM: CT HEAD WITHOUT CONTRAST TECHNIQUE: Contiguous axial images were obtained from the base of the skull through the vertex without intravenous contrast. COMPARISON:  August 11, 2018 FINDINGS: Brain: No evidence of acute infarction, hemorrhage, hydrocephalus, extra-axial collection or mass lesion/mass effect. There is encephalomalacia of the left frontal lobe due to old infarct unchanged. Mild chronic diffuse atrophy is noted. Vascular:  No hyperdense vessel is noted. Skull: No acute abnormality identified Sinuses/Orbits: Minimal mucoperiosteal thickening of the left ethmoid sinus is identified. Other: None. IMPRESSION: Old left frontal lobe infarct unchanged. No focal acute intracranial abnormality identified. Electronically Signed   By: Abelardo Diesel M.D.   On: 04/11/2019 11:23   Ct Angio Neck W And/or Wo Contrast  Result Date: 04/11/2019 CLINICAL DATA:  68 year old female with acute severe headache, worst headache of life. EXAM: CT ANGIOGRAPHY HEAD AND NECK TECHNIQUE: Multidetector CT imaging of the head and neck was performed using the standard protocol during bolus administration of intravenous contrast. Multiplanar CT image reconstructions and MIPs were obtained to evaluate the vascular anatomy. Carotid stenosis measurements (when applicable) are obtained utilizing NASCET criteria, using the distal internal  carotid diameter as the denominator. CONTRAST:  110m OMNIPAQUE IOHEXOL 350 MG/ML SOLN COMPARISON:  Head CT without contrast 1103 hours today. FINDINGS: CTA NECK Skeleton: No acute osseous abnormality identified. Upper chest: Negative lung apices. No superior mediastinal lymphadenopathy. Visible central pulmonary arteries are patent. Other neck: Surgically absent thyroid with small surgical clips. The palatine tonsils appear surgically absent with lingual tonsil hypertrophy. Otherwise negative, no neck mass or lymphadenopathy. Chronic pronounced encephalomalacia in the left MCA territory is noted on the earlier CT. Aortic arch: 3 vessel arch configuration with mild to moderate Calcified aortic atherosclerosis. Right carotid system: No brachiocephalic artery or right CCA origin stenosis. Mildly tortuous right CCA. At the right ICA origin there is posterior soft and calcified plaque resulting in stenosis numerically estimated at 55-60 % with respect to the distal vessel (series 9, image 77). No additional stenosis to the skull base. Left  carotid system: No left CCA origin stenosis. Soft plaque proximal to the bifurcation without stenosis. The left ICA is occluded abruptly just beyond its origin on series 9, image 132, with calcification at the site of occlusion. No reconstitution in the neck. Vertebral arteries: No proximal right subclavian artery or right vertebral artery origin stenosis despite plaque. There is calcified right V1 segment plaque with mild if any stenosis (series 8, image 228). The right vertebral artery is dominant and patent to the skull base without stenosis. No proximal left subclavian artery stenosis despite abundant calcified plaque. Normal left vertebral artery origin. The left vertebral is non dominant and patent to the skull base without stenosis. CTA HEAD Posterior circulation: Dominant right vertebral artery. New V4 segment stenosis. Patent vertebrobasilar junction. Normal right PICA origin. The left PICA may be dominant. Patent basilar artery without stenosis. Patent SCA and PCA origins. Small left posterior communicating artery, the right is diminutive or absent. The left PCA branches are within normal limits. There is mild irregularity in the distal right PCA. Anterior circulation: The right ICA siphon is patent with only mild calcified plaque and no stenosis. The right ICA terminus, right MCA and ACA origins are patent and normal. The left siphon is occluded until there is faint reconstitution at the left ICA terminus near the level of the left posterior communicating artery. The left MCA and ACA origins are patent but irregular. The right A1 and anterior communicating arteries are normal. The left A1 is diminutive and irregular. The bilateral ACA A2 segments appear more symmetric. No ACA branch occlusion is identified, but there is mild to moderate bilateral ACA branch irregularity. The right M1 and right MCA bifurcation are patent without stenosis. The right MCA branches are patent with mild irregularity. The left  M1 and left MCA bifurcation are patent but diminutive. Left MCA branches are highly diminutive. Venous sinuses: Patent. Anatomic variants: Dominant right vertebral artery. Review of the MIP images confirms the above findings IMPRESSION: 1. Negative for intracranial aneurysm or emergent large vessel occlusion. 2. 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 Patent but diminutive left MCA branches and left ACA A1 segment. 3. Cervical right ICA plaque with up to 60% stenosis at the right ICA origin. Mild irregularity of the right anterior circulation compatible with atherosclerosis. 4. Dominant right vertebral artery with calcified plaque but no significant stenosis. Mild irregularity of the posterior circulation compatible with atherosclerosis. 5. Aortic Atherosclerosis (ICD10-I70.0). Electronically Signed   By: HGenevie AnnM.D.   On: 04/11/2019 13:32  Procedures Procedures (including critical care time)  Medications Ordered in ED Medications  prochlorperazine (COMPAZINE) injection 10 mg (10 mg Intravenous Given 04/11/19 1136)  diphenhydrAMINE (BENADRYL) injection 25 mg (25 mg Intravenous Given 04/11/19 1136)  sodium chloride 0.9 % bolus 500 mL (0 mLs Intravenous Stopped 04/11/19 1248)  iohexol (OMNIPAQUE) 350 MG/ML injection 100 mL (100 mLs Intravenous Contrast Given 04/11/19 1259)     Initial Impression / Assessment and Plan / ED Course  I have reviewed the triage vital signs and the nursing notes.  Pertinent labs & imaging results that were available during my care of the patient were reviewed by me and considered in my medical decision making (see chart for details).        69yo female with history above including hx of CVA presents with concern for headache.   CTA head and neck without sign of aneurysm or emergent vessel occlusion, no sign of intracranial bleed.  Given clinical hx with negative CT< do not feel LP indicated at  this time and have low suspicion for [facility].  ESR WNL, no sign of temporal arteritis.  Possible migraines given light sensitivity. Reports improvement after headache cocktail. Recommend PCP or Neurology follow up. Patient discharged in stable condition with understanding of reasons to return.   Final Clinical Impressions(s) / ED Diagnoses   Final diagnoses:  Acute nonintractable headache, unspecified headache type    ED Discharge Orders    None       Gareth Morgan, MD 04/12/19 2256

## 2019-04-11 NOTE — ED Notes (Signed)
CT awaiting results from CMP prior to CTA Head/Neck

## 2019-04-11 NOTE — ED Triage Notes (Signed)
Pt does not speak clearly due to past CVA. She indicates she is having pain to the L side of her head.

## 2019-04-11 NOTE — ED Notes (Signed)
ED Provider at bedside. 

## 2019-05-12 DIAGNOSIS — K5000 Crohn's disease of small intestine without complications: Secondary | ICD-10-CM | POA: Diagnosis not present

## 2019-10-06 DIAGNOSIS — H6692 Otitis media, unspecified, left ear: Secondary | ICD-10-CM | POA: Diagnosis not present

## 2019-10-06 DIAGNOSIS — Z20828 Contact with and (suspected) exposure to other viral communicable diseases: Secondary | ICD-10-CM | POA: Diagnosis not present

## 2019-10-06 DIAGNOSIS — J329 Chronic sinusitis, unspecified: Secondary | ICD-10-CM | POA: Diagnosis not present

## 2019-10-08 DIAGNOSIS — E049 Nontoxic goiter, unspecified: Secondary | ICD-10-CM | POA: Diagnosis not present

## 2019-10-08 DIAGNOSIS — E079 Disorder of thyroid, unspecified: Secondary | ICD-10-CM | POA: Diagnosis not present

## 2019-10-14 DIAGNOSIS — J029 Acute pharyngitis, unspecified: Secondary | ICD-10-CM | POA: Diagnosis not present

## 2019-10-17 ENCOUNTER — Emergency Department (HOSPITAL_BASED_OUTPATIENT_CLINIC_OR_DEPARTMENT_OTHER)
Admission: EM | Admit: 2019-10-17 | Discharge: 2019-10-17 | Disposition: A | Discharge status: Home / Self Care | Payer: Medicare Other | Attending: Emergency Medicine | Admitting: Emergency Medicine

## 2019-10-17 ENCOUNTER — Encounter (HOSPITAL_BASED_OUTPATIENT_CLINIC_OR_DEPARTMENT_OTHER): Payer: Self-pay

## 2019-10-17 ENCOUNTER — Other Ambulatory Visit: Payer: Self-pay

## 2019-10-17 ENCOUNTER — Emergency Department (HOSPITAL_BASED_OUTPATIENT_CLINIC_OR_DEPARTMENT_OTHER): Payer: Medicare Other

## 2019-10-17 DIAGNOSIS — E079 Disorder of thyroid, unspecified: Secondary | ICD-10-CM | POA: Diagnosis not present

## 2019-10-17 DIAGNOSIS — I10 Essential (primary) hypertension: Secondary | ICD-10-CM | POA: Insufficient documentation

## 2019-10-17 DIAGNOSIS — R519 Headache, unspecified: Secondary | ICD-10-CM | POA: Insufficient documentation

## 2019-10-17 DIAGNOSIS — Z79899 Other long term (current) drug therapy: Secondary | ICD-10-CM | POA: Diagnosis not present

## 2019-10-17 DIAGNOSIS — M542 Cervicalgia: Secondary | ICD-10-CM | POA: Diagnosis not present

## 2019-10-17 DIAGNOSIS — Z91048 Other nonmedicinal substance allergy status: Secondary | ICD-10-CM | POA: Diagnosis not present

## 2019-10-17 DIAGNOSIS — Z7982 Long term (current) use of aspirin: Secondary | ICD-10-CM | POA: Diagnosis not present

## 2019-10-17 DIAGNOSIS — I69320 Aphasia following cerebral infarction: Secondary | ICD-10-CM | POA: Insufficient documentation

## 2019-10-17 LAB — CBC WITH DIFFERENTIAL/PLATELET
Abs Immature Granulocytes: 0.01 10*3/uL (ref 0.00–0.07)
Basophils Absolute: 0.1 10*3/uL (ref 0.0–0.1)
Basophils Relative: 1 %
Eosinophils Absolute: 0.3 10*3/uL (ref 0.0–0.5)
Eosinophils Relative: 4 %
HCT: 40.4 % (ref 36.0–46.0)
Hemoglobin: 12.9 g/dL (ref 12.0–15.0)
Immature Granulocytes: 0 %
Lymphocytes Relative: 28 %
Lymphs Abs: 2 10*3/uL (ref 0.7–4.0)
MCH: 28.9 pg (ref 26.0–34.0)
MCHC: 31.9 g/dL (ref 30.0–36.0)
MCV: 90.4 fL (ref 80.0–100.0)
Monocytes Absolute: 0.4 10*3/uL (ref 0.1–1.0)
Monocytes Relative: 6 %
Neutro Abs: 4.2 10*3/uL (ref 1.7–7.7)
Neutrophils Relative %: 61 %
Platelets: 249 10*3/uL (ref 150–400)
RBC: 4.47 MIL/uL (ref 3.87–5.11)
RDW: 12.2 % (ref 11.5–15.5)
WBC: 6.9 10*3/uL (ref 4.0–10.5)
nRBC: 0 % (ref 0.0–0.2)

## 2019-10-17 LAB — BASIC METABOLIC PANEL
Anion gap: 8 (ref 5–15)
BUN: 20 mg/dL (ref 8–23)
CO2: 29 mmol/L (ref 22–32)
Calcium: 8.8 mg/dL — ABNORMAL LOW (ref 8.9–10.3)
Chloride: 103 mmol/L (ref 98–111)
Creatinine, Ser: 0.8 mg/dL (ref 0.44–1.00)
GFR calc Af Amer: >60 mL/min (ref >60)
GFR calc non Af Amer: >60 mL/min (ref >60)
Glucose, Bld: 128 mg/dL — ABNORMAL HIGH (ref 70–99)
Potassium: 3.9 mmol/L (ref 3.5–5.1)
Sodium: 140 mmol/L (ref 135–145)

## 2019-10-17 MED ORDER — ACETAMINOPHEN 325 MG PO TABS
650.0000 mg | ORAL_TABLET | Freq: Once | ORAL | Status: AC
  Administered 2019-10-17: 15:00:00 650 mg via ORAL
  Filled 2019-10-17: qty 2

## 2019-10-17 MED ORDER — HYDROCODONE-ACETAMINOPHEN 5-325 MG PO TABS: 1.0000 | ORAL_TABLET | Freq: Four times a day (QID) | ORAL | 0 refills | Status: DC | PRN

## 2019-10-17 MED ORDER — IOHEXOL 300 MG/ML  SOLN
100.0000 mL | Freq: Once | INTRAMUSCULAR | Status: AC | PRN
  Administered 2019-10-17: 75 mL via INTRAVENOUS

## 2019-10-17 MED ORDER — CLINDAMYCIN PHOSPHATE 600 MG/50ML IV SOLN
600.0000 mg | Freq: Once | INTRAVENOUS | Status: AC
  Administered 2019-10-17: 16:00:00 600 mg via INTRAVENOUS
  Filled 2019-10-17: qty 50

## 2019-10-17 MED ORDER — SODIUM CHLORIDE 0.9 % IV SOLN
INTRAVENOUS | Status: DC | PRN
  Administered 2019-10-17: 15:00:00 250 mL via INTRAVENOUS

## 2019-10-17 NOTE — Discharge Instructions (Signed)
Your CT scan was not showing any obvious cause of your symptoms today. We recommend you follow up with your dentist for examination of possible dental cause.  Follow up closely with your primary care.  Return for concerning symptoms.

## 2019-10-17 NOTE — ED Provider Notes (Signed)
[facility] EMERGENCY DEPARTMENT Provider Note   CSN: [id number] Arrival date & time: 10/17/19  1424     History Chief Complaint  Patient presents with  . Facial Pain    Suzanne Barnett is a 69 y.o. female w PMHx HTN, stroke with residual right sided weakness and aphasia, presenting to the ED with complaint of left sided facial pain for 2 weeks. Patient's daughter provides the history, as patient is unable to speak.  She states she began showing pain in her left face and ear about 2 weeks ago.  She was evaluated by her PCP who ordered an outpatient thyroid ultrasound.  The ultrasound showed no acute findings of the thyroid though did show an enlarged lymph node in the left neck.  She was placed on antibiotics, however patient's daughter reports it did not provide improvement in symptoms so on Wednesday of this week she was changed to cefixime 400 mg.  She states today pain seemed to worsen and therefore presents for evaluation.  Patient been holding the left ear, neck and face.  No known fevers.  She was tested for Covid within the last 2 weeks and negative.  The history is provided by a relative. The history is limited by the condition of the patient.       Past Medical History:  Diagnosis Date  . Hypertension   . Stroke ([place])   . Thyroid disease     There are no problems to display for this patient.   Past Surgical History:  Procedure Laterality Date  . CESAREAN SECTION    . THYROID SURGERY       OB History   No obstetric history on file.     No family history on file.  Social History   Tobacco Use  . Smoking status: Former Research scientist (life sciences)  . Smokeless tobacco: Never Used  Substance Use Topics  . Alcohol use: Not Currently  . Drug use: Never    Home Medications Prior to Admission medications   Medication Sig Start Date End Date Taking? Authorizing Provider  cefixime (SUPRAX) 400 MG CAPS capsule Take 400 mg by mouth daily. Started x 3 days ago   Yes [provider]  aspirin 81 MG tablet Take 81 mg by mouth daily.    [provider]  baclofen (LIORESAL) 10 MG tablet Take 10 mg by mouth 3 (three) times daily.    [provider]  calcium carbonate (CALCIUM 600) 600 MG TABS tablet Take 600 mg by mouth 2 (two) times daily with a meal.    [provider]  citalopram (CELEXA) 40 MG tablet Take 40 mg by mouth daily.    [provider]  clopidogrel (PLAVIX) 75 MG tablet Take 75 mg by mouth daily.    [provider]  HYDROcodone-acetaminophen (NORCO/VICODIN) 5-325 MG tablet Take 1 tablet by mouth every 6 (six) hours as needed for severe pain. 10/17/19   Zanetta Dehaan, [place] N, PA-C  levETIRAcetam (KEPPRA) 500 MG tablet Take 500 mg by mouth 2 (two) times daily.    [provider]  Levothyroxine Sodium (SYNTHROID PO) Take by mouth.    [provider]  mesalamine (LIALDA) 1.2 g EC tablet Take 1.2 g by mouth daily with breakfast.    [provider]  simvastatin (ZOCOR) 20 MG tablet Take 20 mg by mouth daily.    [provider]    Allergies    Tape  Review of Systems   Review of Systems  Unable to  perform ROS: Patient nonverbal    Physical Exam Updated Vital Signs BP (!) 132/58 (BP Location: Left Arm)   Pulse 60   Temp 98.8 F (37.1 C) (Oral)   Resp (!) 22   Ht 5\' 5"  (1.651 m)   Wt 92.4 kg   SpO2 96%   BMI 33.90 kg/m   Physical Exam Vitals and nursing note reviewed.  Constitutional:      General: She is not in acute distress.    Appearance: She is well-developed.  HENT:     Head: Normocephalic and atraumatic.     Comments: Patient seems to be tender in the left anterior cervical region as well as the submandibular region.  She also has tenderness to left preauricular area.  Mastoid area appears tender though no redness, bogginess or swelling.  There is no tenderness to the dentition or gingival erythema or fluctuance.  No sublingual edema or tenderness.  No  trismus.  Oropharynx is clear. Eyes:     Conjunctiva/sclera: Conjunctivae normal.  Neck:     Comments: the Cardiovascular:     Rate and Rhythm: Normal rate and regular rhythm.     Heart sounds: Murmur present.  Pulmonary:     Effort: Pulmonary effort is normal. No respiratory distress.     Breath sounds: Normal breath sounds.  Abdominal:     Palpations: Abdomen is soft.  Musculoskeletal:     Cervical back: Normal range of motion and neck supple.  Skin:    General: Skin is warm.  Neurological:     Mental Status: She is alert.  Psychiatric:        Behavior: Behavior normal.     ED Results / Procedures / Treatments   Labs (all labs ordered are listed, but only abnormal results are displayed) Labs Reviewed  BASIC METABOLIC PANEL - Abnormal; Notable for the following components:      Result Value   Glucose, Bld 128 (*)    Calcium 8.8 (*)    All other components within normal limits  CBC WITH DIFFERENTIAL/PLATELET    EKG None  Radiology CT SOFT TISSUE NECK W CONTRAST  Result Date: 10/17/2019 CLINICAL DATA:  Left anterior cervical and submandibular/facial pain. EXAM: CT NECK WITH CONTRAST TECHNIQUE: Multidetector CT imaging of the neck was performed using the standard protocol following the bolus administration of intravenous contrast. CONTRAST:  78mL OMNIPAQUE IOHEXOL 300 MG/ML  SOLN COMPARISON:  Neck CTA 04/11/2019 FINDINGS: Pharynx and larynx: Lingual tonsil thickening that is symmetric and chronic, with left-sided tonsillith. No submucosal edema or airway narrowing. Salivary glands: No inflammation, mass, or stone. Thyroid: Surgically absent Lymph nodes: No adenopathy in the neck. Mildly lobulated upper mediastinal lymph nodes that are stable and considered benign. Vascular: Atherosclerotic calcification, most notable at the brachiocephalic origin, left subclavian origin, and bilateral ICA bulb. There is a chronic occlusion of the left ICA. Limited intracranial: Large area of  left MCA territory encephalomalacia. Visualized orbits: Negative Mastoids and visualized paranasal sinuses: Clear Skeleton: No acute or aggressive finding. Upper chest: 2 small right upper lobe pulmonary nodules measuring up to 3 mm, stable and considered benign. IMPRESSION: 1. No acute finding or explanation for symptoms. 2. Atherosclerosis with chronic left ICA occlusion and remote left MCA territory infarct. Electronically Signed   By: 06/11/2019 M.D.   On: 10/17/2019 17:00    Procedures Procedures (including critical care time)  Medications Ordered in ED Medications  0.9 %  sodium chloride infusion ( Intravenous Stopped 10/17/19 1632)  acetaminophen (  TYLENOL) tablet 650 mg (650 mg Oral Given 10/17/19 1523)  clindamycin (CLEOCIN) IVPB 600 mg (0 mg Intravenous Stopped 10/17/19 1629)  iohexol (OMNIPAQUE) 300 MG/ML solution 100 mL (75 mLs Intravenous Contrast Given 10/17/19 1623)    ED Course  I have reviewed the triage vital signs and the nursing notes.  Pertinent labs & imaging results that were available during my care of the patient were reviewed by me and considered in my medical decision making (see chart for details).    MDM Rules/Calculators/A&P                      Patient with history of stroke and baseline aphasia, presenting to the ED with her daughter with left-sided face and neck pain for months 2 weeks.  She was evaluated by her PCP, with outpatient thyroid ultrasound showing left-sided enlarged lymph node.  She was started on antibiotics which were then changed on Wednesday of this week to cefixime.  Pain seemed to worsen today and therefore her daughter brought her to the ED for evaluation.  No known fevers or chills.  History is limited as patient is able to answer questions.  She does appear to have tenderness preauricular, postauricular, left anterior cervical and submandibular regions.  No skin changes.  Normal TM.  No obvious dental abscess though does have multiple fillings  throughout.  No sublingual edema or tenderness.  Patient discussed with and evaluated by Dr. Particia Nearing.  IV antibiotics ordered, and CT scan of the neck for further evaluation.   CT is negative for obvious cause of pts symptoms. Consider possible dental pain vs possible trigeminal neuralgia?  Recommend patient continue taking antibiotic as prescribed.  Recommend she follow-up with her dentist for evaluation as well as close PCP follow-up.  Will prescribe small amount of pain medication for severe pain.  She is aware this medication may make her drowsy, instructed to take in the presence of caregiver for fall risk.  Strict return precautions.  Patient and her daughter agreeable to plan and safer discharge  [place] Controlled Substance reporting System queried  Final Clinical Impression(s) / ED Diagnoses Final diagnoses:  Left facial pain    Rx / DC Orders ED Discharge Orders         Ordered    HYDROcodone-acetaminophen (NORCO/VICODIN) 5-325 MG tablet  Every 6 hours PRN     10/17/19 1717           Kristel Durkee, [place] N, PA-C 10/17/19 1739    Jacalyn Lefevre, MD 10/18/19 [phone]

## 2019-10-17 NOTE — ED Triage Notes (Signed)
Pt arrives with daughter to ED, pt is nonverbal r/t previous stroke. Pt is holding left side of her face/ear. Daughter reports she has been seen recently for a swollen lymph node in left neck and placed on antibiotics that were changed this past Wednesday. Pt has had negative Covid test since symptoms started.

## 2019-11-03 DIAGNOSIS — Z79899 Other long term (current) drug therapy: Secondary | ICD-10-CM | POA: Diagnosis not present

## 2019-11-03 DIAGNOSIS — E785 Hyperlipidemia, unspecified: Secondary | ICD-10-CM | POA: Diagnosis not present

## 2019-11-09 DIAGNOSIS — I69320 Aphasia following cerebral infarction: Secondary | ICD-10-CM | POA: Diagnosis not present

## 2019-11-09 DIAGNOSIS — I69359 Hemiplegia and hemiparesis following cerebral infarction affecting unspecified side: Secondary | ICD-10-CM | POA: Diagnosis not present

## 2019-11-09 DIAGNOSIS — Z6832 Body mass index (BMI) 32.0-32.9, adult: Secondary | ICD-10-CM | POA: Diagnosis not present

## 2019-11-09 DIAGNOSIS — Z0000 Encounter for general adult medical examination without abnormal findings: Secondary | ICD-10-CM | POA: Diagnosis not present

## 2019-11-15 ENCOUNTER — Emergency Department (HOSPITAL_COMMUNITY)
Admission: EM | Admit: 2019-11-15 | Discharge: 2019-11-15 | Disposition: A | Discharge status: Home / Self Care | Payer: Medicare Other | Attending: Emergency Medicine | Admitting: Emergency Medicine

## 2019-11-15 ENCOUNTER — Encounter (HOSPITAL_COMMUNITY): Payer: Self-pay

## 2019-11-15 ENCOUNTER — Other Ambulatory Visit: Payer: Self-pay

## 2019-11-15 ENCOUNTER — Emergency Department (HOSPITAL_COMMUNITY): Payer: Medicare Other

## 2019-11-15 DIAGNOSIS — R42 Dizziness and giddiness: Secondary | ICD-10-CM | POA: Diagnosis not present

## 2019-11-15 DIAGNOSIS — W19XXXA Unspecified fall, initial encounter: Secondary | ICD-10-CM | POA: Insufficient documentation

## 2019-11-15 DIAGNOSIS — Z7902 Long term (current) use of antithrombotics/antiplatelets: Secondary | ICD-10-CM | POA: Insufficient documentation

## 2019-11-15 DIAGNOSIS — S3993XA Unspecified injury of pelvis, initial encounter: Secondary | ICD-10-CM | POA: Diagnosis not present

## 2019-11-15 DIAGNOSIS — S299XXA Unspecified injury of thorax, initial encounter: Secondary | ICD-10-CM | POA: Diagnosis not present

## 2019-11-15 DIAGNOSIS — T1490XA Injury, unspecified, initial encounter: Secondary | ICD-10-CM

## 2019-11-15 DIAGNOSIS — Y929 Unspecified place or not applicable: Secondary | ICD-10-CM | POA: Insufficient documentation

## 2019-11-15 DIAGNOSIS — R58 Hemorrhage, not elsewhere classified: Secondary | ICD-10-CM | POA: Diagnosis not present

## 2019-11-15 DIAGNOSIS — R262 Difficulty in walking, not elsewhere classified: Secondary | ICD-10-CM | POA: Diagnosis not present

## 2019-11-15 DIAGNOSIS — R404 Transient alteration of awareness: Secondary | ICD-10-CM | POA: Diagnosis not present

## 2019-11-15 DIAGNOSIS — Y9389 Activity, other specified: Secondary | ICD-10-CM | POA: Insufficient documentation

## 2019-11-15 DIAGNOSIS — S51811A Laceration without foreign body of right forearm, initial encounter: Secondary | ICD-10-CM | POA: Diagnosis not present

## 2019-11-15 DIAGNOSIS — I959 Hypotension, unspecified: Secondary | ICD-10-CM | POA: Diagnosis not present

## 2019-11-15 DIAGNOSIS — S0990XA Unspecified injury of head, initial encounter: Secondary | ICD-10-CM | POA: Diagnosis not present

## 2019-11-15 DIAGNOSIS — I10 Essential (primary) hypertension: Secondary | ICD-10-CM | POA: Diagnosis not present

## 2019-11-15 DIAGNOSIS — Y999 Unspecified external cause status: Secondary | ICD-10-CM | POA: Insufficient documentation

## 2019-11-15 DIAGNOSIS — Z7982 Long term (current) use of aspirin: Secondary | ICD-10-CM | POA: Insufficient documentation

## 2019-11-15 DIAGNOSIS — I69820 Aphasia following other cerebrovascular disease: Secondary | ICD-10-CM | POA: Diagnosis not present

## 2019-11-15 DIAGNOSIS — S199XXA Unspecified injury of neck, initial encounter: Secondary | ICD-10-CM | POA: Diagnosis not present

## 2019-11-15 LAB — COMPREHENSIVE METABOLIC PANEL
ALT: 19 U/L (ref 0–44)
AST: 20 U/L (ref 15–41)
Albumin: 4 g/dL (ref 3.5–5.0)
Alkaline Phosphatase: 57 U/L (ref 38–126)
Anion gap: 11 (ref 5–15)
BUN: 17 mg/dL (ref 8–23)
CO2: 25 mmol/L (ref 22–32)
Calcium: 9.6 mg/dL (ref 8.9–10.3)
Chloride: 103 mmol/L (ref 98–111)
Creatinine, Ser: 1.02 mg/dL — ABNORMAL HIGH (ref 0.44–1.00)
GFR calc Af Amer: >60 mL/min (ref >60)
GFR calc non Af Amer: 57 mL/min — ABNORMAL LOW (ref >60)
Glucose, Bld: 97 mg/dL (ref 70–99)
Potassium: 4 mmol/L (ref 3.5–5.1)
Sodium: 139 mmol/L (ref 135–145)
Total Bilirubin: 0.4 mg/dL (ref 0.3–1.2)
Total Protein: 7.5 g/dL (ref 6.5–8.1)

## 2019-11-15 LAB — CBC
HCT: 43.3 % (ref 36.0–46.0)
Hemoglobin: 13.7 g/dL (ref 12.0–15.0)
MCH: 29.3 pg (ref 26.0–34.0)
MCHC: 31.6 g/dL (ref 30.0–36.0)
MCV: 92.7 fL (ref 80.0–100.0)
Platelets: 265 10*3/uL (ref 150–400)
RBC: 4.67 MIL/uL (ref 3.87–5.11)
RDW: 12.5 % (ref 11.5–15.5)
WBC: 7.9 10*3/uL (ref 4.0–10.5)
nRBC: 0 % (ref 0.0–0.2)

## 2019-11-15 LAB — PROTIME-INR
INR: 0.9 (ref 0.8–1.2)
Prothrombin Time: 12 seconds (ref 11.4–15.2)

## 2019-11-15 LAB — I-STAT CHEM 8, ED
BUN: 22 mg/dL (ref 8–23)
Calcium, Ion: 1.19 mmol/L (ref 1.15–1.40)
Chloride: 102 mmol/L (ref 98–111)
Creatinine, Ser: 0.9 mg/dL (ref 0.44–1.00)
Glucose, Bld: 89 mg/dL (ref 70–99)
HCT: 41 % (ref 36.0–46.0)
Hemoglobin: 13.9 g/dL (ref 12.0–15.0)
Potassium: 3.8 mmol/L (ref 3.5–5.1)
Sodium: 139 mmol/L (ref 135–145)
TCO2: 30 mmol/L (ref 22–32)

## 2019-11-15 LAB — SAMPLE TO BLOOD BANK

## 2019-11-15 LAB — ETHANOL: Alcohol, Ethyl (B): <10 mg/dL (ref <10)

## 2019-11-15 LAB — LACTIC ACID, PLASMA: Lactic Acid, Venous: 1.4 mmol/L (ref 0.5–1.9)

## 2019-11-15 NOTE — ED Provider Notes (Signed)
[facility] EMERGENCY DEPARTMENT Provider Note   CSN: [id number] Arrival date & time: 11/15/19  1908     History Chief Complaint  Patient presents with  . Fall    Suzanne Barnett is a 69 y.o. female.  The history is provided by the patient, the EMS personnel and medical records. The history is limited by the condition of the patient.   The patient is a 69 year old female with a past medical history of CVA who presents to the ED as a level 2 trauma for a unwitnessed fall.  Patient fell at her facility, had a small skin tear to her right forearm and went to the office of the facility to be evaluated.  EMS was at the facility seeing another patient and evaluate the patient and brought her here because she is on Plavix and may have hit her head.  The patient is at her mental status baseline, she had reported feeling some dizziness earlier but history is limited due to patient's chronic aphasia.  On arrival the patient denies any pain.  Symptoms are mild, symptoms are constant, symptoms are unchanged.    Past Medical History:  Diagnosis Date  . Hypertension   . Stroke [facility])     There are no problems to display for this patient.      OB History   No obstetric history on file.     No family history on file.  Social History   Tobacco Use  . Smoking status: Never Smoker  . Smokeless tobacco: Never Used  Substance Use Topics  . Alcohol use: Not Currently  . Drug use: Not Currently    Home Medications Prior to Admission medications   Medication Sig Start Date End Date Taking? Authorizing Provider  aspirin EC 81 MG tablet Take 81 mg by mouth daily.   Yes [provider]    Allergies    Tape  Review of Systems   Review of Systems  Unable to perform ROS: Patient nonverbal  Neurological: Positive for dizziness.    Physical Exam Updated Vital Signs BP (!) 155/89   Pulse (!) 54   Temp 98.3 F (36.8 C) (Oral)   Resp 16   Ht 5\' 3"  (1.6 m)    Wt 83.9 kg   SpO2 100%   BMI 32.77 kg/m   Physical Exam Vitals and nursing note reviewed.  Constitutional:      Appearance: She is well-developed. She is not toxic-appearing or diaphoretic.  HENT:     Head: Normocephalic and atraumatic.     Mouth/Throat:     Mouth: Mucous membranes are moist.     Pharynx: Oropharynx is clear.  Eyes:     Conjunctiva/sclera: Conjunctivae normal.     Pupils: Pupils are equal, round, and reactive to light.  Neck:     Comments: No C-spine tenderness, step-off or deformity Cardiovascular:     Rate and Rhythm: Normal rate and regular rhythm.     Pulses: Normal pulses.     Heart sounds: No murmur.  Pulmonary:     Effort: Pulmonary effort is normal. No respiratory distress.     Breath sounds: Normal breath sounds.  Abdominal:     Palpations: Abdomen is soft.     Tenderness: There is no abdominal tenderness. There is no guarding or rebound.  Musculoskeletal:        General: No tenderness or deformity.     Cervical back: Neck supple.     Comments: No bony tenderness on my  exam.  Skin:    General: Skin is warm and dry.     Comments: Small superficial skin tear to the right forearm  Neurological:     Mental Status: She is alert. Mental status is at baseline.  Psychiatric:        Mood and Affect: Mood normal.     ED Results / Procedures / Treatments   Labs (all labs ordered are listed, but only abnormal results are displayed) Labs Reviewed  COMPREHENSIVE METABOLIC PANEL - Abnormal; Notable for the following components:      Result Value   Creatinine, Ser 1.02 (*)    GFR calc non Af Amer 57 (*)    All other components within normal limits  CBC  ETHANOL  LACTIC ACID, PLASMA  PROTIME-INR  URINALYSIS, ROUTINE W REFLEX MICROSCOPIC  I-STAT CHEM 8, ED  SAMPLE TO BLOOD BANK    EKG None  Radiology CT HEAD WO CONTRAST  Result Date: 11/15/2019 CLINICAL DATA:  69 year old female status post fall. Chronic left ICA occlusion. EXAM: CT HEAD  WITHOUT CONTRAST TECHNIQUE: Contiguous axial images were obtained from the base of the skull through the vertex without intravenous contrast. COMPARISON:  CT head and cervical spine 08/11/2018. Head CT and CTA head and neck 04/11/2019. FINDINGS: Brain: Chronic left MCA territory infarct with encephalomalacia, brainstem Wallerian degeneration, and ex vacuo enlargement of the left lateral ventricle. Stable gray-white matter differentiation throughout the brain. No midline shift, ventriculomegaly, mass effect, evidence of mass lesion, intracranial hemorrhage or evidence of cortically based acute infarction. Streak artifacts suspected in the pons today. Vascular: Calcified atherosclerosis at the skull base. No suspicious intracranial vascular hyperdensity. Skull: Stable and intact. Sinuses/Orbits: Visualized paranasal sinuses and mastoids are stable and well pneumatized. Other: No orbit or scalp soft tissue injury identified. IMPRESSION: 1. No acute intracranial abnormality or acute traumatic injury identified. 2. Chronic left MCA infarct. Electronically Signed   By: Odessa Fleming M.D.   On: 11/15/2019 19:58   CT CERVICAL SPINE WO CONTRAST  Result Date: 11/15/2019 CLINICAL DATA:  69 year old female status post fall. Chronic left ICA occlusion. EXAM: CT CERVICAL SPINE WITHOUT CONTRAST TECHNIQUE: Multidetector CT imaging of the cervical spine was performed without intravenous contrast. Multiplanar CT image reconstructions were also generated. COMPARISON:  CT head today. CTA head and neck 04/11/2019, CT cervical spine 10/22/2017. FINDINGS: Alignment: Improved cervical lordosis compared to 2019. Stable mild levoconvex cervical scoliosis. Cervicothoracic junction alignment is within normal limits. Bilateral posterior element alignment is within normal limits. Skull base and vertebrae: Visualized skull base is intact. No atlanto-occipital dissociation. Increased but degenerative appearing subchondral Schmorl's node of the C3  inferior endplate. No acute osseous abnormality identified. Soft tissues and spinal canal: No prevertebral fluid or swelling. No visible canal hematoma. Chronic surgical clips at the thyroid bed. Disc levels: Increased C3-C4 endplate degeneration since 2019. Stable cervical spine degeneration otherwise. Upper chest: Negative visible upper chest. IMPRESSION: 1. No acute traumatic injury identified in the cervical spine. 2. Increased C3-C4 endplate degeneration. Electronically Signed   By: Odessa Fleming M.D.   On: 11/15/2019 20:02   DG Pelvis Portable  Result Date: 11/15/2019 CLINICAL DATA:  Pain status post fall EXAM: PORTABLE PELVIS 1-2 VIEWS COMPARISON:  None. FINDINGS: There is no evidence of pelvic fracture or diastasis. No pelvic bone lesions are seen. IMPRESSION: Negative. Electronically Signed   By: Katherine Mantle M.D.   On: 11/15/2019 19:26   DG Chest Port 1 View  Result Date: 11/15/2019 CLINICAL DATA:  Pain status  post fall EXAM: PORTABLE CHEST 1 VIEW COMPARISON:  None. FINDINGS: The heart size is normal. Aortic calcifications are noted. There is no pneumothorax or large pleural effusion. No acute osseous abnormality. Surgical clips project over the patient's neck. IMPRESSION: No active disease. Electronically Signed   By: Katherine Mantle M.D.   On: 11/15/2019 19:26    Procedures Procedures (including critical care time)  Medications Ordered in ED Medications - No data to display  ED Course  I have reviewed the triage vital signs and the nursing notes.  Pertinent labs & imaging results that were available during my care of the patient were reviewed by me and considered in my medical decision making (see chart for details).    MDM Rules/Calculators/A&P                      Here after unwitnessed fall.  Made a level 2 trauma secondary to the patient being on a blood thinner.  CT head C-spine without emergent changes, does show her chronic previous CVA.  No other evidence of trauma on  my exam.  EKG with normal axis and intervals, no evidence of dangerous arrhythmia or ischemia.  Reassuring labs, patient had reported feeling dizzy, she reports that this has resolved when asked.  No evidence of dangerous arrhythmia, symptomatic anemia, patient feeling well at this time.  Discussed with the patient's daughter who is at the bedside, she will have the patient stay with her tonight and will have her follow-up with her primary care provider soon as possible.  Strict return precautions provided, discharged home in stable condition.   Final Clinical Impression(s) / ED Diagnoses Final diagnoses:  Fall, initial encounter    Rx / DC Orders ED Discharge Orders    None       Astria Jordahl, [place], MD 11/16/19 3419    Blane Ohara, MD 11/17/19 0930

## 2019-11-15 NOTE — ED Triage Notes (Signed)
Pt BIB [facility] for eval of unwitnessed fall. Pt was lives at [facility] and fell in her room. Went to office and reported fall d/t a skin tear to R FA that wouldn't stop bleeding. EMS transported here for further. Pt takes plavix. Nonverbal at baseline d/t hx of a prev CVA. GCS 15, no head trauma. Pt does endorse dizziness prior to falling

## 2019-11-15 NOTE — ED Notes (Signed)
Patient verbalizes understanding of discharge instructions. Opportunity for questioning and answers were provided. Armband removed by staff, pt discharged from ED in wheelchair to home.   

## 2019-11-15 NOTE — ED Notes (Signed)
Pt left with Shanda Bumps, RN in [place] while pts daughter gets vehicle.

## 2019-11-16 ENCOUNTER — Encounter (HOSPITAL_BASED_OUTPATIENT_CLINIC_OR_DEPARTMENT_OTHER): Payer: Self-pay

## 2019-11-26 DIAGNOSIS — S61411A Laceration without foreign body of right hand, initial encounter: Secondary | ICD-10-CM | POA: Diagnosis not present

## 2019-11-26 DIAGNOSIS — Z6830 Body mass index (BMI) 30.0-30.9, adult: Secondary | ICD-10-CM | POA: Diagnosis not present

## 2020-03-28 DIAGNOSIS — M170 Bilateral primary osteoarthritis of knee: Secondary | ICD-10-CM | POA: Diagnosis not present

## 2020-03-29 DIAGNOSIS — Z022 Encounter for examination for admission to residential institution: Secondary | ICD-10-CM | POA: Diagnosis not present

## 2020-03-29 DIAGNOSIS — Z202 Contact with and (suspected) exposure to infections with a predominantly sexual mode of transmission: Secondary | ICD-10-CM | POA: Diagnosis not present

## 2020-04-11 DIAGNOSIS — Z1152 Encounter for screening for COVID-19: Secondary | ICD-10-CM | POA: Diagnosis not present

## 2020-05-19 DIAGNOSIS — Z1152 Encounter for screening for COVID-19: Secondary | ICD-10-CM | POA: Diagnosis not present

## 2020-05-23 DIAGNOSIS — Z1152 Encounter for screening for COVID-19: Secondary | ICD-10-CM | POA: Diagnosis not present

## 2020-05-30 DIAGNOSIS — Z1152 Encounter for screening for COVID-19: Secondary | ICD-10-CM | POA: Diagnosis not present

## 2020-06-06 DIAGNOSIS — Z1152 Encounter for screening for COVID-19: Secondary | ICD-10-CM | POA: Diagnosis not present

## 2020-06-13 DIAGNOSIS — Z1152 Encounter for screening for COVID-19: Secondary | ICD-10-CM | POA: Diagnosis not present

## 2020-07-01 IMAGING — DX DG CHEST 1V PORT
1 series · 1 of 1 positions shown · non-contrast
Comparison: None.

CLINICAL DATA: Pain status post fall

EXAM:
PORTABLE CHEST 1 VIEW

[chest ap]
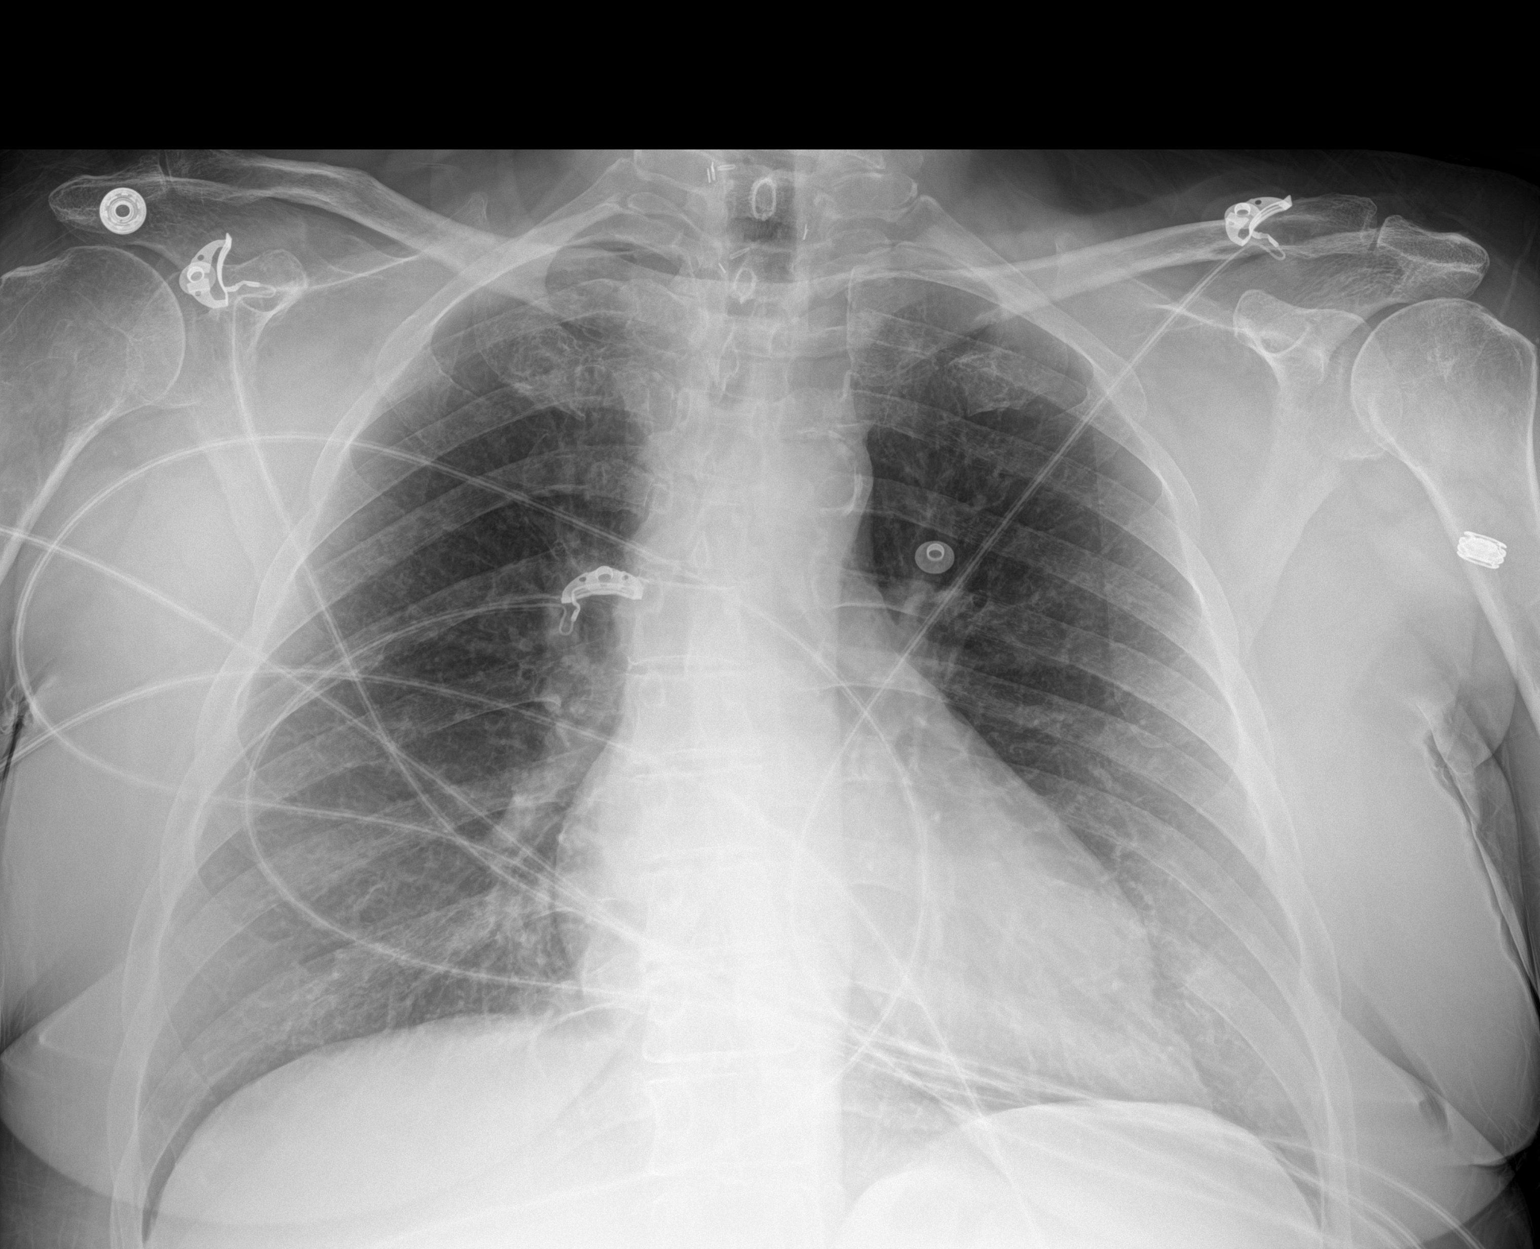

[1 of 1 positions shown; findings below may reference images not displayed]

FINDINGS: The heart size is normal. Aortic calcifications are noted. There is
no pneumothorax or large pleural effusion. No acute osseous
abnormality. Surgical clips project over the patient's neck.
IMPRESSION: No active disease.

## 2020-07-01 IMAGING — DX DG PORTABLE PELVIS
1 series · 1 of 1 positions shown · non-contrast
Comparison: None.

CLINICAL DATA: Pain status post fall

EXAM:
PORTABLE PELVIS 1-2 VIEWS

[pelvis ap]
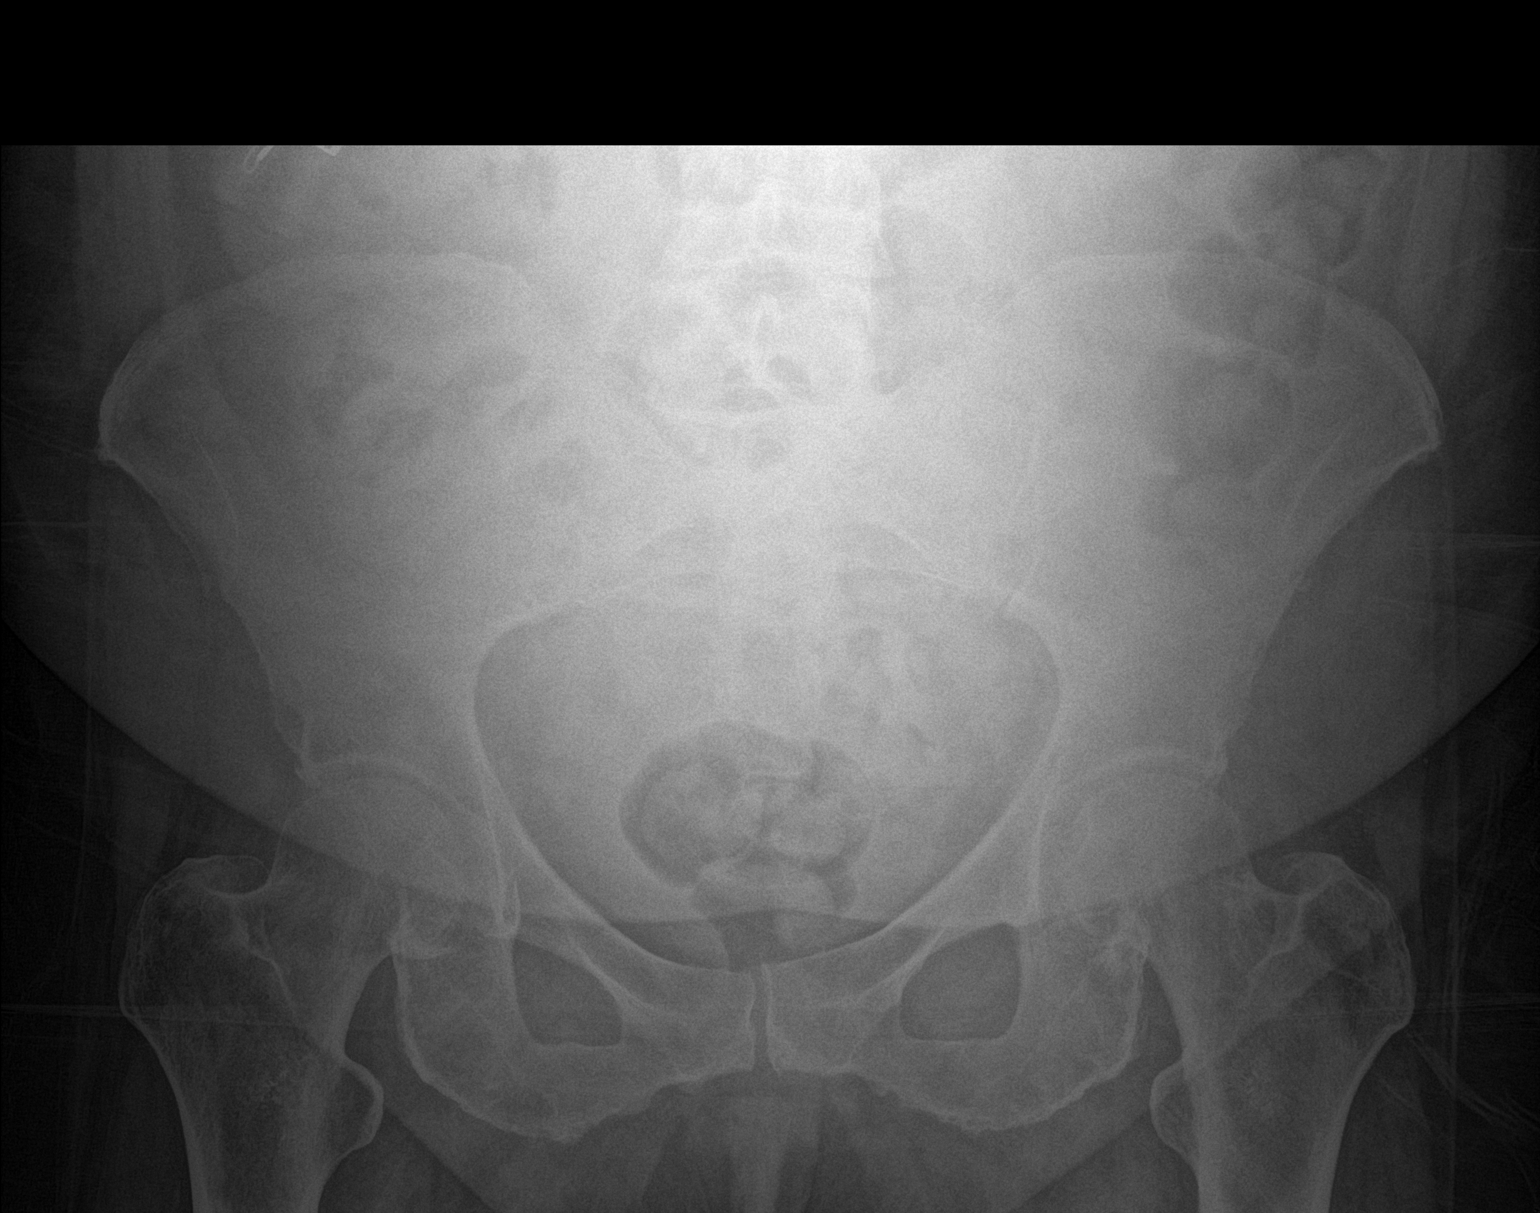

[1 of 1 positions shown; findings below may reference images not displayed]

FINDINGS: There is no evidence of pelvic fracture or diastasis. No pelvic bone
lesions are seen.
IMPRESSION: Negative.

## 2020-07-04 DIAGNOSIS — Z1152 Encounter for screening for COVID-19: Secondary | ICD-10-CM | POA: Diagnosis not present

## 2020-07-19 DIAGNOSIS — I69351 Hemiplegia and hemiparesis following cerebral infarction affecting right dominant side: Secondary | ICD-10-CM | POA: Diagnosis not present

## 2020-07-19 DIAGNOSIS — I639 Cerebral infarction, unspecified: Secondary | ICD-10-CM | POA: Diagnosis not present

## 2020-07-19 DIAGNOSIS — M24531 Contracture, right wrist: Secondary | ICD-10-CM | POA: Diagnosis not present

## 2020-07-19 DIAGNOSIS — M24541 Contracture, right hand: Secondary | ICD-10-CM | POA: Diagnosis not present

## 2020-07-20 DIAGNOSIS — I639 Cerebral infarction, unspecified: Secondary | ICD-10-CM | POA: Diagnosis not present

## 2020-07-20 DIAGNOSIS — I69351 Hemiplegia and hemiparesis following cerebral infarction affecting right dominant side: Secondary | ICD-10-CM | POA: Diagnosis not present

## 2020-07-20 DIAGNOSIS — M24541 Contracture, right hand: Secondary | ICD-10-CM | POA: Diagnosis not present

## 2020-07-20 DIAGNOSIS — M24531 Contracture, right wrist: Secondary | ICD-10-CM | POA: Diagnosis not present

## 2020-07-21 DIAGNOSIS — I69351 Hemiplegia and hemiparesis following cerebral infarction affecting right dominant side: Secondary | ICD-10-CM | POA: Diagnosis not present

## 2020-07-21 DIAGNOSIS — M24531 Contracture, right wrist: Secondary | ICD-10-CM | POA: Diagnosis not present

## 2020-07-21 DIAGNOSIS — M24541 Contracture, right hand: Secondary | ICD-10-CM | POA: Diagnosis not present

## 2020-07-21 DIAGNOSIS — I639 Cerebral infarction, unspecified: Secondary | ICD-10-CM | POA: Diagnosis not present

## 2020-07-22 DIAGNOSIS — M24541 Contracture, right hand: Secondary | ICD-10-CM | POA: Diagnosis not present

## 2020-07-22 DIAGNOSIS — M24531 Contracture, right wrist: Secondary | ICD-10-CM | POA: Diagnosis not present

## 2020-07-22 DIAGNOSIS — I639 Cerebral infarction, unspecified: Secondary | ICD-10-CM | POA: Diagnosis not present

## 2020-07-22 DIAGNOSIS — I69351 Hemiplegia and hemiparesis following cerebral infarction affecting right dominant side: Secondary | ICD-10-CM | POA: Diagnosis not present

## 2020-07-25 DIAGNOSIS — M24531 Contracture, right wrist: Secondary | ICD-10-CM | POA: Diagnosis not present

## 2020-07-25 DIAGNOSIS — I69351 Hemiplegia and hemiparesis following cerebral infarction affecting right dominant side: Secondary | ICD-10-CM | POA: Diagnosis not present

## 2020-07-25 DIAGNOSIS — I639 Cerebral infarction, unspecified: Secondary | ICD-10-CM | POA: Diagnosis not present

## 2020-07-25 DIAGNOSIS — M24541 Contracture, right hand: Secondary | ICD-10-CM | POA: Diagnosis not present

## 2020-07-26 DIAGNOSIS — M24531 Contracture, right wrist: Secondary | ICD-10-CM | POA: Diagnosis not present

## 2020-07-26 DIAGNOSIS — I639 Cerebral infarction, unspecified: Secondary | ICD-10-CM | POA: Diagnosis not present

## 2020-07-26 DIAGNOSIS — M24541 Contracture, right hand: Secondary | ICD-10-CM | POA: Diagnosis not present

## 2020-07-26 DIAGNOSIS — I69351 Hemiplegia and hemiparesis following cerebral infarction affecting right dominant side: Secondary | ICD-10-CM | POA: Diagnosis not present

## 2020-07-27 DIAGNOSIS — I639 Cerebral infarction, unspecified: Secondary | ICD-10-CM | POA: Diagnosis not present

## 2020-07-27 DIAGNOSIS — M24541 Contracture, right hand: Secondary | ICD-10-CM | POA: Diagnosis not present

## 2020-07-27 DIAGNOSIS — I69351 Hemiplegia and hemiparesis following cerebral infarction affecting right dominant side: Secondary | ICD-10-CM | POA: Diagnosis not present

## 2020-07-27 DIAGNOSIS — M24531 Contracture, right wrist: Secondary | ICD-10-CM | POA: Diagnosis not present

## 2020-07-28 DIAGNOSIS — I639 Cerebral infarction, unspecified: Secondary | ICD-10-CM | POA: Diagnosis not present

## 2020-07-28 DIAGNOSIS — M24531 Contracture, right wrist: Secondary | ICD-10-CM | POA: Diagnosis not present

## 2020-07-28 DIAGNOSIS — I69351 Hemiplegia and hemiparesis following cerebral infarction affecting right dominant side: Secondary | ICD-10-CM | POA: Diagnosis not present

## 2020-07-28 DIAGNOSIS — M24541 Contracture, right hand: Secondary | ICD-10-CM | POA: Diagnosis not present

## 2020-07-29 DIAGNOSIS — I69351 Hemiplegia and hemiparesis following cerebral infarction affecting right dominant side: Secondary | ICD-10-CM | POA: Diagnosis not present

## 2020-07-29 DIAGNOSIS — M24541 Contracture, right hand: Secondary | ICD-10-CM | POA: Diagnosis not present

## 2020-07-29 DIAGNOSIS — I639 Cerebral infarction, unspecified: Secondary | ICD-10-CM | POA: Diagnosis not present

## 2020-07-29 DIAGNOSIS — M24531 Contracture, right wrist: Secondary | ICD-10-CM | POA: Diagnosis not present

## 2020-08-01 DIAGNOSIS — I679 Cerebrovascular disease, unspecified: Secondary | ICD-10-CM | POA: Diagnosis not present

## 2020-08-01 DIAGNOSIS — M24531 Contracture, right wrist: Secondary | ICD-10-CM | POA: Diagnosis not present

## 2020-08-01 DIAGNOSIS — M24541 Contracture, right hand: Secondary | ICD-10-CM | POA: Diagnosis not present

## 2020-08-01 DIAGNOSIS — I639 Cerebral infarction, unspecified: Secondary | ICD-10-CM | POA: Diagnosis not present

## 2020-08-01 DIAGNOSIS — E039 Hypothyroidism, unspecified: Secondary | ICD-10-CM | POA: Diagnosis not present

## 2020-08-01 DIAGNOSIS — I69351 Hemiplegia and hemiparesis following cerebral infarction affecting right dominant side: Secondary | ICD-10-CM | POA: Diagnosis not present

## 2020-08-01 DIAGNOSIS — G40909 Epilepsy, unspecified, not intractable, without status epilepticus: Secondary | ICD-10-CM | POA: Diagnosis not present

## 2020-08-01 DIAGNOSIS — Z79899 Other long term (current) drug therapy: Secondary | ICD-10-CM | POA: Diagnosis not present

## 2020-08-02 DIAGNOSIS — M24541 Contracture, right hand: Secondary | ICD-10-CM | POA: Diagnosis not present

## 2020-08-02 DIAGNOSIS — I639 Cerebral infarction, unspecified: Secondary | ICD-10-CM | POA: Diagnosis not present

## 2020-08-02 DIAGNOSIS — M24531 Contracture, right wrist: Secondary | ICD-10-CM | POA: Diagnosis not present

## 2020-08-02 DIAGNOSIS — I69351 Hemiplegia and hemiparesis following cerebral infarction affecting right dominant side: Secondary | ICD-10-CM | POA: Diagnosis not present

## 2020-08-03 DIAGNOSIS — I69351 Hemiplegia and hemiparesis following cerebral infarction affecting right dominant side: Secondary | ICD-10-CM | POA: Diagnosis not present

## 2020-08-03 DIAGNOSIS — I639 Cerebral infarction, unspecified: Secondary | ICD-10-CM | POA: Diagnosis not present

## 2020-08-03 DIAGNOSIS — M24541 Contracture, right hand: Secondary | ICD-10-CM | POA: Diagnosis not present

## 2020-08-03 DIAGNOSIS — M24531 Contracture, right wrist: Secondary | ICD-10-CM | POA: Diagnosis not present

## 2020-08-04 DIAGNOSIS — M24541 Contracture, right hand: Secondary | ICD-10-CM | POA: Diagnosis not present

## 2020-08-04 DIAGNOSIS — M24531 Contracture, right wrist: Secondary | ICD-10-CM | POA: Diagnosis not present

## 2020-08-04 DIAGNOSIS — I69351 Hemiplegia and hemiparesis following cerebral infarction affecting right dominant side: Secondary | ICD-10-CM | POA: Diagnosis not present

## 2020-08-04 DIAGNOSIS — I639 Cerebral infarction, unspecified: Secondary | ICD-10-CM | POA: Diagnosis not present

## 2020-08-05 DIAGNOSIS — M24541 Contracture, right hand: Secondary | ICD-10-CM | POA: Diagnosis not present

## 2020-08-05 DIAGNOSIS — I639 Cerebral infarction, unspecified: Secondary | ICD-10-CM | POA: Diagnosis not present

## 2020-08-05 DIAGNOSIS — I69351 Hemiplegia and hemiparesis following cerebral infarction affecting right dominant side: Secondary | ICD-10-CM | POA: Diagnosis not present

## 2020-08-05 DIAGNOSIS — M24531 Contracture, right wrist: Secondary | ICD-10-CM | POA: Diagnosis not present

## 2020-08-08 DIAGNOSIS — I69351 Hemiplegia and hemiparesis following cerebral infarction affecting right dominant side: Secondary | ICD-10-CM | POA: Diagnosis not present

## 2020-08-08 DIAGNOSIS — M24531 Contracture, right wrist: Secondary | ICD-10-CM | POA: Diagnosis not present

## 2020-08-08 DIAGNOSIS — I639 Cerebral infarction, unspecified: Secondary | ICD-10-CM | POA: Diagnosis not present

## 2020-08-08 DIAGNOSIS — M24541 Contracture, right hand: Secondary | ICD-10-CM | POA: Diagnosis not present

## 2020-08-09 DIAGNOSIS — I639 Cerebral infarction, unspecified: Secondary | ICD-10-CM | POA: Diagnosis not present

## 2020-08-09 DIAGNOSIS — I69351 Hemiplegia and hemiparesis following cerebral infarction affecting right dominant side: Secondary | ICD-10-CM | POA: Diagnosis not present

## 2020-08-09 DIAGNOSIS — M24531 Contracture, right wrist: Secondary | ICD-10-CM | POA: Diagnosis not present

## 2020-08-09 DIAGNOSIS — M24541 Contracture, right hand: Secondary | ICD-10-CM | POA: Diagnosis not present

## 2020-08-10 DIAGNOSIS — R262 Difficulty in walking, not elsewhere classified: Secondary | ICD-10-CM | POA: Diagnosis not present

## 2020-08-10 DIAGNOSIS — M24531 Contracture, right wrist: Secondary | ICD-10-CM | POA: Diagnosis not present

## 2020-08-10 DIAGNOSIS — M24541 Contracture, right hand: Secondary | ICD-10-CM | POA: Diagnosis not present

## 2020-08-10 DIAGNOSIS — I639 Cerebral infarction, unspecified: Secondary | ICD-10-CM | POA: Diagnosis not present

## 2020-08-10 DIAGNOSIS — I69351 Hemiplegia and hemiparesis following cerebral infarction affecting right dominant side: Secondary | ICD-10-CM | POA: Diagnosis not present

## 2020-08-11 DIAGNOSIS — M24531 Contracture, right wrist: Secondary | ICD-10-CM | POA: Diagnosis not present

## 2020-08-11 DIAGNOSIS — I69351 Hemiplegia and hemiparesis following cerebral infarction affecting right dominant side: Secondary | ICD-10-CM | POA: Diagnosis not present

## 2020-08-11 DIAGNOSIS — I639 Cerebral infarction, unspecified: Secondary | ICD-10-CM | POA: Diagnosis not present

## 2020-08-11 DIAGNOSIS — R262 Difficulty in walking, not elsewhere classified: Secondary | ICD-10-CM | POA: Diagnosis not present

## 2020-08-11 DIAGNOSIS — M24541 Contracture, right hand: Secondary | ICD-10-CM | POA: Diagnosis not present

## 2020-08-12 DIAGNOSIS — I69351 Hemiplegia and hemiparesis following cerebral infarction affecting right dominant side: Secondary | ICD-10-CM | POA: Diagnosis not present

## 2020-08-12 DIAGNOSIS — R262 Difficulty in walking, not elsewhere classified: Secondary | ICD-10-CM | POA: Diagnosis not present

## 2020-08-12 DIAGNOSIS — M24531 Contracture, right wrist: Secondary | ICD-10-CM | POA: Diagnosis not present

## 2020-08-12 DIAGNOSIS — M24541 Contracture, right hand: Secondary | ICD-10-CM | POA: Diagnosis not present

## 2020-08-12 DIAGNOSIS — I639 Cerebral infarction, unspecified: Secondary | ICD-10-CM | POA: Diagnosis not present

## 2020-08-15 DIAGNOSIS — I69351 Hemiplegia and hemiparesis following cerebral infarction affecting right dominant side: Secondary | ICD-10-CM | POA: Diagnosis not present

## 2020-08-15 DIAGNOSIS — M24541 Contracture, right hand: Secondary | ICD-10-CM | POA: Diagnosis not present

## 2020-08-15 DIAGNOSIS — I639 Cerebral infarction, unspecified: Secondary | ICD-10-CM | POA: Diagnosis not present

## 2020-08-15 DIAGNOSIS — M24531 Contracture, right wrist: Secondary | ICD-10-CM | POA: Diagnosis not present

## 2020-08-15 DIAGNOSIS — R262 Difficulty in walking, not elsewhere classified: Secondary | ICD-10-CM | POA: Diagnosis not present

## 2020-08-16 DIAGNOSIS — I69351 Hemiplegia and hemiparesis following cerebral infarction affecting right dominant side: Secondary | ICD-10-CM | POA: Diagnosis not present

## 2020-08-16 DIAGNOSIS — I639 Cerebral infarction, unspecified: Secondary | ICD-10-CM | POA: Diagnosis not present

## 2020-08-16 DIAGNOSIS — M24541 Contracture, right hand: Secondary | ICD-10-CM | POA: Diagnosis not present

## 2020-08-16 DIAGNOSIS — M24531 Contracture, right wrist: Secondary | ICD-10-CM | POA: Diagnosis not present

## 2020-08-16 DIAGNOSIS — R262 Difficulty in walking, not elsewhere classified: Secondary | ICD-10-CM | POA: Diagnosis not present

## 2020-08-17 DIAGNOSIS — M24541 Contracture, right hand: Secondary | ICD-10-CM | POA: Diagnosis not present

## 2020-08-17 DIAGNOSIS — I69351 Hemiplegia and hemiparesis following cerebral infarction affecting right dominant side: Secondary | ICD-10-CM | POA: Diagnosis not present

## 2020-08-17 DIAGNOSIS — R262 Difficulty in walking, not elsewhere classified: Secondary | ICD-10-CM | POA: Diagnosis not present

## 2020-08-17 DIAGNOSIS — M24531 Contracture, right wrist: Secondary | ICD-10-CM | POA: Diagnosis not present

## 2020-08-17 DIAGNOSIS — I639 Cerebral infarction, unspecified: Secondary | ICD-10-CM | POA: Diagnosis not present

## 2020-08-18 DIAGNOSIS — I69351 Hemiplegia and hemiparesis following cerebral infarction affecting right dominant side: Secondary | ICD-10-CM | POA: Diagnosis not present

## 2020-08-18 DIAGNOSIS — M24541 Contracture, right hand: Secondary | ICD-10-CM | POA: Diagnosis not present

## 2020-08-18 DIAGNOSIS — M24531 Contracture, right wrist: Secondary | ICD-10-CM | POA: Diagnosis not present

## 2020-08-18 DIAGNOSIS — I639 Cerebral infarction, unspecified: Secondary | ICD-10-CM | POA: Diagnosis not present

## 2020-08-18 DIAGNOSIS — R262 Difficulty in walking, not elsewhere classified: Secondary | ICD-10-CM | POA: Diagnosis not present

## 2020-08-19 DIAGNOSIS — I639 Cerebral infarction, unspecified: Secondary | ICD-10-CM | POA: Diagnosis not present

## 2020-08-19 DIAGNOSIS — M24541 Contracture, right hand: Secondary | ICD-10-CM | POA: Diagnosis not present

## 2020-08-19 DIAGNOSIS — M24531 Contracture, right wrist: Secondary | ICD-10-CM | POA: Diagnosis not present

## 2020-08-19 DIAGNOSIS — R262 Difficulty in walking, not elsewhere classified: Secondary | ICD-10-CM | POA: Diagnosis not present

## 2020-08-19 DIAGNOSIS — I69351 Hemiplegia and hemiparesis following cerebral infarction affecting right dominant side: Secondary | ICD-10-CM | POA: Diagnosis not present

## 2020-08-22 DIAGNOSIS — I639 Cerebral infarction, unspecified: Secondary | ICD-10-CM | POA: Diagnosis not present

## 2020-08-22 DIAGNOSIS — M24531 Contracture, right wrist: Secondary | ICD-10-CM | POA: Diagnosis not present

## 2020-08-22 DIAGNOSIS — M24541 Contracture, right hand: Secondary | ICD-10-CM | POA: Diagnosis not present

## 2020-08-22 DIAGNOSIS — R262 Difficulty in walking, not elsewhere classified: Secondary | ICD-10-CM | POA: Diagnosis not present

## 2020-08-22 DIAGNOSIS — I69351 Hemiplegia and hemiparesis following cerebral infarction affecting right dominant side: Secondary | ICD-10-CM | POA: Diagnosis not present

## 2020-08-23 DIAGNOSIS — R262 Difficulty in walking, not elsewhere classified: Secondary | ICD-10-CM | POA: Diagnosis not present

## 2020-08-23 DIAGNOSIS — M24541 Contracture, right hand: Secondary | ICD-10-CM | POA: Diagnosis not present

## 2020-08-23 DIAGNOSIS — I639 Cerebral infarction, unspecified: Secondary | ICD-10-CM | POA: Diagnosis not present

## 2020-08-23 DIAGNOSIS — I69351 Hemiplegia and hemiparesis following cerebral infarction affecting right dominant side: Secondary | ICD-10-CM | POA: Diagnosis not present

## 2020-08-23 DIAGNOSIS — M24531 Contracture, right wrist: Secondary | ICD-10-CM | POA: Diagnosis not present

## 2020-08-24 DIAGNOSIS — I69351 Hemiplegia and hemiparesis following cerebral infarction affecting right dominant side: Secondary | ICD-10-CM | POA: Diagnosis not present

## 2020-08-24 DIAGNOSIS — I639 Cerebral infarction, unspecified: Secondary | ICD-10-CM | POA: Diagnosis not present

## 2020-08-24 DIAGNOSIS — R262 Difficulty in walking, not elsewhere classified: Secondary | ICD-10-CM | POA: Diagnosis not present

## 2020-08-24 DIAGNOSIS — M24531 Contracture, right wrist: Secondary | ICD-10-CM | POA: Diagnosis not present

## 2020-08-24 DIAGNOSIS — M24541 Contracture, right hand: Secondary | ICD-10-CM | POA: Diagnosis not present

## 2020-08-25 DIAGNOSIS — M24531 Contracture, right wrist: Secondary | ICD-10-CM | POA: Diagnosis not present

## 2020-08-25 DIAGNOSIS — R262 Difficulty in walking, not elsewhere classified: Secondary | ICD-10-CM | POA: Diagnosis not present

## 2020-08-25 DIAGNOSIS — I69351 Hemiplegia and hemiparesis following cerebral infarction affecting right dominant side: Secondary | ICD-10-CM | POA: Diagnosis not present

## 2020-08-25 DIAGNOSIS — I639 Cerebral infarction, unspecified: Secondary | ICD-10-CM | POA: Diagnosis not present

## 2020-08-25 DIAGNOSIS — M24541 Contracture, right hand: Secondary | ICD-10-CM | POA: Diagnosis not present

## 2020-08-26 DIAGNOSIS — R262 Difficulty in walking, not elsewhere classified: Secondary | ICD-10-CM | POA: Diagnosis not present

## 2020-08-26 DIAGNOSIS — I69351 Hemiplegia and hemiparesis following cerebral infarction affecting right dominant side: Secondary | ICD-10-CM | POA: Diagnosis not present

## 2020-08-26 DIAGNOSIS — I639 Cerebral infarction, unspecified: Secondary | ICD-10-CM | POA: Diagnosis not present

## 2020-08-26 DIAGNOSIS — M24541 Contracture, right hand: Secondary | ICD-10-CM | POA: Diagnosis not present

## 2020-08-26 DIAGNOSIS — M24531 Contracture, right wrist: Secondary | ICD-10-CM | POA: Diagnosis not present

## 2020-08-29 DIAGNOSIS — M24541 Contracture, right hand: Secondary | ICD-10-CM | POA: Diagnosis not present

## 2020-08-29 DIAGNOSIS — I69351 Hemiplegia and hemiparesis following cerebral infarction affecting right dominant side: Secondary | ICD-10-CM | POA: Diagnosis not present

## 2020-08-29 DIAGNOSIS — M24531 Contracture, right wrist: Secondary | ICD-10-CM | POA: Diagnosis not present

## 2020-08-29 DIAGNOSIS — I639 Cerebral infarction, unspecified: Secondary | ICD-10-CM | POA: Diagnosis not present

## 2020-08-29 DIAGNOSIS — R262 Difficulty in walking, not elsewhere classified: Secondary | ICD-10-CM | POA: Diagnosis not present

## 2020-08-30 DIAGNOSIS — M24541 Contracture, right hand: Secondary | ICD-10-CM | POA: Diagnosis not present

## 2020-08-30 DIAGNOSIS — I639 Cerebral infarction, unspecified: Secondary | ICD-10-CM | POA: Diagnosis not present

## 2020-08-30 DIAGNOSIS — I69351 Hemiplegia and hemiparesis following cerebral infarction affecting right dominant side: Secondary | ICD-10-CM | POA: Diagnosis not present

## 2020-08-30 DIAGNOSIS — M24531 Contracture, right wrist: Secondary | ICD-10-CM | POA: Diagnosis not present

## 2020-08-30 DIAGNOSIS — R262 Difficulty in walking, not elsewhere classified: Secondary | ICD-10-CM | POA: Diagnosis not present

## 2020-08-31 DIAGNOSIS — I639 Cerebral infarction, unspecified: Secondary | ICD-10-CM | POA: Diagnosis not present

## 2020-08-31 DIAGNOSIS — R262 Difficulty in walking, not elsewhere classified: Secondary | ICD-10-CM | POA: Diagnosis not present

## 2020-08-31 DIAGNOSIS — I69351 Hemiplegia and hemiparesis following cerebral infarction affecting right dominant side: Secondary | ICD-10-CM | POA: Diagnosis not present

## 2020-08-31 DIAGNOSIS — M24531 Contracture, right wrist: Secondary | ICD-10-CM | POA: Diagnosis not present

## 2020-08-31 DIAGNOSIS — M24541 Contracture, right hand: Secondary | ICD-10-CM | POA: Diagnosis not present

## 2020-09-01 DIAGNOSIS — R262 Difficulty in walking, not elsewhere classified: Secondary | ICD-10-CM | POA: Diagnosis not present

## 2020-09-01 DIAGNOSIS — M24541 Contracture, right hand: Secondary | ICD-10-CM | POA: Diagnosis not present

## 2020-09-01 DIAGNOSIS — M24531 Contracture, right wrist: Secondary | ICD-10-CM | POA: Diagnosis not present

## 2020-09-01 DIAGNOSIS — I639 Cerebral infarction, unspecified: Secondary | ICD-10-CM | POA: Diagnosis not present

## 2020-09-01 DIAGNOSIS — I69351 Hemiplegia and hemiparesis following cerebral infarction affecting right dominant side: Secondary | ICD-10-CM | POA: Diagnosis not present

## 2020-09-05 DIAGNOSIS — M24541 Contracture, right hand: Secondary | ICD-10-CM | POA: Diagnosis not present

## 2020-09-05 DIAGNOSIS — R262 Difficulty in walking, not elsewhere classified: Secondary | ICD-10-CM | POA: Diagnosis not present

## 2020-09-05 DIAGNOSIS — I639 Cerebral infarction, unspecified: Secondary | ICD-10-CM | POA: Diagnosis not present

## 2020-09-05 DIAGNOSIS — E039 Hypothyroidism, unspecified: Secondary | ICD-10-CM | POA: Diagnosis not present

## 2020-09-05 DIAGNOSIS — I69351 Hemiplegia and hemiparesis following cerebral infarction affecting right dominant side: Secondary | ICD-10-CM | POA: Diagnosis not present

## 2020-09-05 DIAGNOSIS — M24531 Contracture, right wrist: Secondary | ICD-10-CM | POA: Diagnosis not present

## 2020-09-05 DIAGNOSIS — E559 Vitamin D deficiency, unspecified: Secondary | ICD-10-CM | POA: Diagnosis not present

## 2020-09-05 DIAGNOSIS — I679 Cerebrovascular disease, unspecified: Secondary | ICD-10-CM | POA: Diagnosis not present

## 2020-09-06 DIAGNOSIS — I639 Cerebral infarction, unspecified: Secondary | ICD-10-CM | POA: Diagnosis not present

## 2020-09-06 DIAGNOSIS — M24531 Contracture, right wrist: Secondary | ICD-10-CM | POA: Diagnosis not present

## 2020-09-06 DIAGNOSIS — M24541 Contracture, right hand: Secondary | ICD-10-CM | POA: Diagnosis not present

## 2020-09-06 DIAGNOSIS — I69351 Hemiplegia and hemiparesis following cerebral infarction affecting right dominant side: Secondary | ICD-10-CM | POA: Diagnosis not present

## 2020-09-06 DIAGNOSIS — R262 Difficulty in walking, not elsewhere classified: Secondary | ICD-10-CM | POA: Diagnosis not present

## 2020-09-07 DIAGNOSIS — R262 Difficulty in walking, not elsewhere classified: Secondary | ICD-10-CM | POA: Diagnosis not present

## 2020-09-07 DIAGNOSIS — M24541 Contracture, right hand: Secondary | ICD-10-CM | POA: Diagnosis not present

## 2020-09-07 DIAGNOSIS — I69351 Hemiplegia and hemiparesis following cerebral infarction affecting right dominant side: Secondary | ICD-10-CM | POA: Diagnosis not present

## 2020-09-07 DIAGNOSIS — M24531 Contracture, right wrist: Secondary | ICD-10-CM | POA: Diagnosis not present

## 2020-09-07 DIAGNOSIS — I639 Cerebral infarction, unspecified: Secondary | ICD-10-CM | POA: Diagnosis not present

## 2020-09-09 DIAGNOSIS — R262 Difficulty in walking, not elsewhere classified: Secondary | ICD-10-CM | POA: Diagnosis not present

## 2020-09-09 DIAGNOSIS — I69351 Hemiplegia and hemiparesis following cerebral infarction affecting right dominant side: Secondary | ICD-10-CM | POA: Diagnosis not present

## 2020-09-09 DIAGNOSIS — I639 Cerebral infarction, unspecified: Secondary | ICD-10-CM | POA: Diagnosis not present

## 2020-09-09 DIAGNOSIS — M24541 Contracture, right hand: Secondary | ICD-10-CM | POA: Diagnosis not present

## 2020-09-09 DIAGNOSIS — M24531 Contracture, right wrist: Secondary | ICD-10-CM | POA: Diagnosis not present

## 2020-09-12 DIAGNOSIS — I69351 Hemiplegia and hemiparesis following cerebral infarction affecting right dominant side: Secondary | ICD-10-CM | POA: Diagnosis not present

## 2020-09-12 DIAGNOSIS — R262 Difficulty in walking, not elsewhere classified: Secondary | ICD-10-CM | POA: Diagnosis not present

## 2020-09-12 DIAGNOSIS — I639 Cerebral infarction, unspecified: Secondary | ICD-10-CM | POA: Diagnosis not present

## 2020-09-13 DIAGNOSIS — I639 Cerebral infarction, unspecified: Secondary | ICD-10-CM | POA: Diagnosis not present

## 2020-09-13 DIAGNOSIS — I69351 Hemiplegia and hemiparesis following cerebral infarction affecting right dominant side: Secondary | ICD-10-CM | POA: Diagnosis not present

## 2020-09-13 DIAGNOSIS — R262 Difficulty in walking, not elsewhere classified: Secondary | ICD-10-CM | POA: Diagnosis not present

## 2020-09-14 DIAGNOSIS — R262 Difficulty in walking, not elsewhere classified: Secondary | ICD-10-CM | POA: Diagnosis not present

## 2020-09-14 DIAGNOSIS — I69351 Hemiplegia and hemiparesis following cerebral infarction affecting right dominant side: Secondary | ICD-10-CM | POA: Diagnosis not present

## 2020-09-14 DIAGNOSIS — I639 Cerebral infarction, unspecified: Secondary | ICD-10-CM | POA: Diagnosis not present

## 2020-09-15 DIAGNOSIS — R262 Difficulty in walking, not elsewhere classified: Secondary | ICD-10-CM | POA: Diagnosis not present

## 2020-09-15 DIAGNOSIS — I69351 Hemiplegia and hemiparesis following cerebral infarction affecting right dominant side: Secondary | ICD-10-CM | POA: Diagnosis not present

## 2020-09-15 DIAGNOSIS — I639 Cerebral infarction, unspecified: Secondary | ICD-10-CM | POA: Diagnosis not present

## 2020-09-16 DIAGNOSIS — I639 Cerebral infarction, unspecified: Secondary | ICD-10-CM | POA: Diagnosis not present

## 2020-09-16 DIAGNOSIS — R262 Difficulty in walking, not elsewhere classified: Secondary | ICD-10-CM | POA: Diagnosis not present

## 2020-09-16 DIAGNOSIS — I69351 Hemiplegia and hemiparesis following cerebral infarction affecting right dominant side: Secondary | ICD-10-CM | POA: Diagnosis not present

## 2020-10-03 DIAGNOSIS — F32A Depression, unspecified: Secondary | ICD-10-CM | POA: Diagnosis not present

## 2020-10-03 DIAGNOSIS — R569 Unspecified convulsions: Secondary | ICD-10-CM | POA: Diagnosis not present

## 2020-10-03 DIAGNOSIS — K5090 Crohn's disease, unspecified, without complications: Secondary | ICD-10-CM | POA: Diagnosis not present

## 2020-10-05 DIAGNOSIS — I10 Essential (primary) hypertension: Secondary | ICD-10-CM | POA: Diagnosis not present

## 2020-10-31 DIAGNOSIS — E039 Hypothyroidism, unspecified: Secondary | ICD-10-CM | POA: Diagnosis not present

## 2020-10-31 DIAGNOSIS — G4700 Insomnia, unspecified: Secondary | ICD-10-CM | POA: Diagnosis not present

## 2020-10-31 DIAGNOSIS — I679 Cerebrovascular disease, unspecified: Secondary | ICD-10-CM | POA: Diagnosis not present

## 2020-11-14 DIAGNOSIS — G40909 Epilepsy, unspecified, not intractable, without status epilepticus: Secondary | ICD-10-CM | POA: Diagnosis not present

## 2020-11-14 DIAGNOSIS — E559 Vitamin D deficiency, unspecified: Secondary | ICD-10-CM | POA: Diagnosis not present

## 2020-11-14 DIAGNOSIS — Z79899 Other long term (current) drug therapy: Secondary | ICD-10-CM | POA: Diagnosis not present

## 2020-11-14 DIAGNOSIS — E039 Hypothyroidism, unspecified: Secondary | ICD-10-CM | POA: Diagnosis not present

## 2020-11-14 DIAGNOSIS — I10 Essential (primary) hypertension: Secondary | ICD-10-CM | POA: Diagnosis not present

## 2020-12-26 DIAGNOSIS — E559 Vitamin D deficiency, unspecified: Secondary | ICD-10-CM | POA: Diagnosis not present

## 2020-12-26 DIAGNOSIS — R52 Pain, unspecified: Secondary | ICD-10-CM | POA: Diagnosis not present

## 2020-12-26 DIAGNOSIS — R569 Unspecified convulsions: Secondary | ICD-10-CM | POA: Diagnosis not present

## 2021-01-03 DIAGNOSIS — M24541 Contracture, right hand: Secondary | ICD-10-CM | POA: Diagnosis not present

## 2021-01-03 DIAGNOSIS — I639 Cerebral infarction, unspecified: Secondary | ICD-10-CM | POA: Diagnosis not present

## 2021-01-03 DIAGNOSIS — I69351 Hemiplegia and hemiparesis following cerebral infarction affecting right dominant side: Secondary | ICD-10-CM | POA: Diagnosis not present

## 2021-01-03 DIAGNOSIS — M24531 Contracture, right wrist: Secondary | ICD-10-CM | POA: Diagnosis not present

## 2021-01-04 DIAGNOSIS — M24541 Contracture, right hand: Secondary | ICD-10-CM | POA: Diagnosis not present

## 2021-01-04 DIAGNOSIS — I69351 Hemiplegia and hemiparesis following cerebral infarction affecting right dominant side: Secondary | ICD-10-CM | POA: Diagnosis not present

## 2021-01-04 DIAGNOSIS — I639 Cerebral infarction, unspecified: Secondary | ICD-10-CM | POA: Diagnosis not present

## 2021-01-04 DIAGNOSIS — M24531 Contracture, right wrist: Secondary | ICD-10-CM | POA: Diagnosis not present

## 2021-01-05 DIAGNOSIS — I639 Cerebral infarction, unspecified: Secondary | ICD-10-CM | POA: Diagnosis not present

## 2021-01-05 DIAGNOSIS — M24531 Contracture, right wrist: Secondary | ICD-10-CM | POA: Diagnosis not present

## 2021-01-05 DIAGNOSIS — M24541 Contracture, right hand: Secondary | ICD-10-CM | POA: Diagnosis not present

## 2021-01-05 DIAGNOSIS — I69351 Hemiplegia and hemiparesis following cerebral infarction affecting right dominant side: Secondary | ICD-10-CM | POA: Diagnosis not present

## 2021-01-06 DIAGNOSIS — I639 Cerebral infarction, unspecified: Secondary | ICD-10-CM | POA: Diagnosis not present

## 2021-01-06 DIAGNOSIS — M24541 Contracture, right hand: Secondary | ICD-10-CM | POA: Diagnosis not present

## 2021-01-06 DIAGNOSIS — I69351 Hemiplegia and hemiparesis following cerebral infarction affecting right dominant side: Secondary | ICD-10-CM | POA: Diagnosis not present

## 2021-01-06 DIAGNOSIS — M24531 Contracture, right wrist: Secondary | ICD-10-CM | POA: Diagnosis not present

## 2021-01-09 DIAGNOSIS — M24531 Contracture, right wrist: Secondary | ICD-10-CM | POA: Diagnosis not present

## 2021-01-09 DIAGNOSIS — I69351 Hemiplegia and hemiparesis following cerebral infarction affecting right dominant side: Secondary | ICD-10-CM | POA: Diagnosis not present

## 2021-01-09 DIAGNOSIS — I639 Cerebral infarction, unspecified: Secondary | ICD-10-CM | POA: Diagnosis not present

## 2021-01-09 DIAGNOSIS — M24541 Contracture, right hand: Secondary | ICD-10-CM | POA: Diagnosis not present

## 2021-01-10 DIAGNOSIS — M24541 Contracture, right hand: Secondary | ICD-10-CM | POA: Diagnosis not present

## 2021-01-10 DIAGNOSIS — I69351 Hemiplegia and hemiparesis following cerebral infarction affecting right dominant side: Secondary | ICD-10-CM | POA: Diagnosis not present

## 2021-01-10 DIAGNOSIS — I639 Cerebral infarction, unspecified: Secondary | ICD-10-CM | POA: Diagnosis not present

## 2021-01-10 DIAGNOSIS — M24531 Contracture, right wrist: Secondary | ICD-10-CM | POA: Diagnosis not present

## 2021-01-11 DIAGNOSIS — M24531 Contracture, right wrist: Secondary | ICD-10-CM | POA: Diagnosis not present

## 2021-01-11 DIAGNOSIS — M24541 Contracture, right hand: Secondary | ICD-10-CM | POA: Diagnosis not present

## 2021-01-11 DIAGNOSIS — I639 Cerebral infarction, unspecified: Secondary | ICD-10-CM | POA: Diagnosis not present

## 2021-01-11 DIAGNOSIS — I69351 Hemiplegia and hemiparesis following cerebral infarction affecting right dominant side: Secondary | ICD-10-CM | POA: Diagnosis not present

## 2021-01-12 DIAGNOSIS — M24541 Contracture, right hand: Secondary | ICD-10-CM | POA: Diagnosis not present

## 2021-01-12 DIAGNOSIS — I639 Cerebral infarction, unspecified: Secondary | ICD-10-CM | POA: Diagnosis not present

## 2021-01-12 DIAGNOSIS — I69351 Hemiplegia and hemiparesis following cerebral infarction affecting right dominant side: Secondary | ICD-10-CM | POA: Diagnosis not present

## 2021-01-12 DIAGNOSIS — M24531 Contracture, right wrist: Secondary | ICD-10-CM | POA: Diagnosis not present

## 2021-01-13 DIAGNOSIS — I69351 Hemiplegia and hemiparesis following cerebral infarction affecting right dominant side: Secondary | ICD-10-CM | POA: Diagnosis not present

## 2021-01-13 DIAGNOSIS — M24541 Contracture, right hand: Secondary | ICD-10-CM | POA: Diagnosis not present

## 2021-01-13 DIAGNOSIS — I639 Cerebral infarction, unspecified: Secondary | ICD-10-CM | POA: Diagnosis not present

## 2021-01-13 DIAGNOSIS — M24531 Contracture, right wrist: Secondary | ICD-10-CM | POA: Diagnosis not present

## 2021-01-16 DIAGNOSIS — M24541 Contracture, right hand: Secondary | ICD-10-CM | POA: Diagnosis not present

## 2021-01-16 DIAGNOSIS — I639 Cerebral infarction, unspecified: Secondary | ICD-10-CM | POA: Diagnosis not present

## 2021-01-16 DIAGNOSIS — M24531 Contracture, right wrist: Secondary | ICD-10-CM | POA: Diagnosis not present

## 2021-01-16 DIAGNOSIS — I69351 Hemiplegia and hemiparesis following cerebral infarction affecting right dominant side: Secondary | ICD-10-CM | POA: Diagnosis not present

## 2021-01-17 DIAGNOSIS — I69351 Hemiplegia and hemiparesis following cerebral infarction affecting right dominant side: Secondary | ICD-10-CM | POA: Diagnosis not present

## 2021-01-17 DIAGNOSIS — I639 Cerebral infarction, unspecified: Secondary | ICD-10-CM | POA: Diagnosis not present

## 2021-01-17 DIAGNOSIS — M24541 Contracture, right hand: Secondary | ICD-10-CM | POA: Diagnosis not present

## 2021-01-17 DIAGNOSIS — M24531 Contracture, right wrist: Secondary | ICD-10-CM | POA: Diagnosis not present

## 2021-01-18 DIAGNOSIS — I639 Cerebral infarction, unspecified: Secondary | ICD-10-CM | POA: Diagnosis not present

## 2021-01-18 DIAGNOSIS — M24541 Contracture, right hand: Secondary | ICD-10-CM | POA: Diagnosis not present

## 2021-01-18 DIAGNOSIS — M24531 Contracture, right wrist: Secondary | ICD-10-CM | POA: Diagnosis not present

## 2021-01-18 DIAGNOSIS — I69351 Hemiplegia and hemiparesis following cerebral infarction affecting right dominant side: Secondary | ICD-10-CM | POA: Diagnosis not present

## 2021-01-19 DIAGNOSIS — I69351 Hemiplegia and hemiparesis following cerebral infarction affecting right dominant side: Secondary | ICD-10-CM | POA: Diagnosis not present

## 2021-01-19 DIAGNOSIS — I639 Cerebral infarction, unspecified: Secondary | ICD-10-CM | POA: Diagnosis not present

## 2021-01-19 DIAGNOSIS — M24541 Contracture, right hand: Secondary | ICD-10-CM | POA: Diagnosis not present

## 2021-01-19 DIAGNOSIS — M24531 Contracture, right wrist: Secondary | ICD-10-CM | POA: Diagnosis not present

## 2021-01-20 DIAGNOSIS — M24531 Contracture, right wrist: Secondary | ICD-10-CM | POA: Diagnosis not present

## 2021-01-20 DIAGNOSIS — I69351 Hemiplegia and hemiparesis following cerebral infarction affecting right dominant side: Secondary | ICD-10-CM | POA: Diagnosis not present

## 2021-01-20 DIAGNOSIS — M24541 Contracture, right hand: Secondary | ICD-10-CM | POA: Diagnosis not present

## 2021-01-20 DIAGNOSIS — I639 Cerebral infarction, unspecified: Secondary | ICD-10-CM | POA: Diagnosis not present

## 2021-01-23 DIAGNOSIS — M24531 Contracture, right wrist: Secondary | ICD-10-CM | POA: Diagnosis not present

## 2021-01-23 DIAGNOSIS — I69351 Hemiplegia and hemiparesis following cerebral infarction affecting right dominant side: Secondary | ICD-10-CM | POA: Diagnosis not present

## 2021-01-23 DIAGNOSIS — I639 Cerebral infarction, unspecified: Secondary | ICD-10-CM | POA: Diagnosis not present

## 2021-01-23 DIAGNOSIS — M24541 Contracture, right hand: Secondary | ICD-10-CM | POA: Diagnosis not present

## 2021-01-24 DIAGNOSIS — M24531 Contracture, right wrist: Secondary | ICD-10-CM | POA: Diagnosis not present

## 2021-01-24 DIAGNOSIS — M24541 Contracture, right hand: Secondary | ICD-10-CM | POA: Diagnosis not present

## 2021-01-24 DIAGNOSIS — I639 Cerebral infarction, unspecified: Secondary | ICD-10-CM | POA: Diagnosis not present

## 2021-01-24 DIAGNOSIS — I69351 Hemiplegia and hemiparesis following cerebral infarction affecting right dominant side: Secondary | ICD-10-CM | POA: Diagnosis not present

## 2021-01-25 DIAGNOSIS — M24531 Contracture, right wrist: Secondary | ICD-10-CM | POA: Diagnosis not present

## 2021-01-25 DIAGNOSIS — I69351 Hemiplegia and hemiparesis following cerebral infarction affecting right dominant side: Secondary | ICD-10-CM | POA: Diagnosis not present

## 2021-01-25 DIAGNOSIS — M24541 Contracture, right hand: Secondary | ICD-10-CM | POA: Diagnosis not present

## 2021-01-25 DIAGNOSIS — I639 Cerebral infarction, unspecified: Secondary | ICD-10-CM | POA: Diagnosis not present

## 2021-01-26 DIAGNOSIS — M24531 Contracture, right wrist: Secondary | ICD-10-CM | POA: Diagnosis not present

## 2021-01-26 DIAGNOSIS — M24541 Contracture, right hand: Secondary | ICD-10-CM | POA: Diagnosis not present

## 2021-01-26 DIAGNOSIS — I69351 Hemiplegia and hemiparesis following cerebral infarction affecting right dominant side: Secondary | ICD-10-CM | POA: Diagnosis not present

## 2021-01-26 DIAGNOSIS — I639 Cerebral infarction, unspecified: Secondary | ICD-10-CM | POA: Diagnosis not present

## 2021-01-27 DIAGNOSIS — M24541 Contracture, right hand: Secondary | ICD-10-CM | POA: Diagnosis not present

## 2021-01-27 DIAGNOSIS — M24531 Contracture, right wrist: Secondary | ICD-10-CM | POA: Diagnosis not present

## 2021-01-27 DIAGNOSIS — I69351 Hemiplegia and hemiparesis following cerebral infarction affecting right dominant side: Secondary | ICD-10-CM | POA: Diagnosis not present

## 2021-01-27 DIAGNOSIS — I639 Cerebral infarction, unspecified: Secondary | ICD-10-CM | POA: Diagnosis not present

## 2021-01-30 DIAGNOSIS — M24531 Contracture, right wrist: Secondary | ICD-10-CM | POA: Diagnosis not present

## 2021-01-30 DIAGNOSIS — I69351 Hemiplegia and hemiparesis following cerebral infarction affecting right dominant side: Secondary | ICD-10-CM | POA: Diagnosis not present

## 2021-01-30 DIAGNOSIS — M24541 Contracture, right hand: Secondary | ICD-10-CM | POA: Diagnosis not present

## 2021-01-30 DIAGNOSIS — I639 Cerebral infarction, unspecified: Secondary | ICD-10-CM | POA: Diagnosis not present

## 2021-04-07 DIAGNOSIS — E039 Hypothyroidism, unspecified: Secondary | ICD-10-CM | POA: Diagnosis not present

## 2021-04-07 DIAGNOSIS — I679 Cerebrovascular disease, unspecified: Secondary | ICD-10-CM | POA: Diagnosis not present

## 2021-04-07 DIAGNOSIS — G4700 Insomnia, unspecified: Secondary | ICD-10-CM | POA: Diagnosis not present

## 2021-04-07 DIAGNOSIS — R52 Pain, unspecified: Secondary | ICD-10-CM | POA: Diagnosis not present

## 2021-04-07 DIAGNOSIS — E559 Vitamin D deficiency, unspecified: Secondary | ICD-10-CM | POA: Diagnosis not present

## 2021-04-07 DIAGNOSIS — F32A Depression, unspecified: Secondary | ICD-10-CM | POA: Diagnosis not present

## 2021-04-07 DIAGNOSIS — R569 Unspecified convulsions: Secondary | ICD-10-CM | POA: Diagnosis not present

## 2021-04-17 DIAGNOSIS — K5090 Crohn's disease, unspecified, without complications: Secondary | ICD-10-CM | POA: Diagnosis not present

## 2021-04-17 DIAGNOSIS — E039 Hypothyroidism, unspecified: Secondary | ICD-10-CM | POA: Diagnosis not present

## 2021-04-17 DIAGNOSIS — I679 Cerebrovascular disease, unspecified: Secondary | ICD-10-CM | POA: Diagnosis not present

## 2021-04-25 DIAGNOSIS — E039 Hypothyroidism, unspecified: Secondary | ICD-10-CM | POA: Diagnosis not present

## 2021-05-18 DIAGNOSIS — E039 Hypothyroidism, unspecified: Secondary | ICD-10-CM | POA: Diagnosis not present

## 2021-05-31 DIAGNOSIS — R220 Localized swelling, mass and lump, head: Secondary | ICD-10-CM | POA: Diagnosis not present

## 2021-06-01 DIAGNOSIS — E039 Hypothyroidism, unspecified: Secondary | ICD-10-CM | POA: Diagnosis not present

## 2021-06-13 DIAGNOSIS — R569 Unspecified convulsions: Secondary | ICD-10-CM | POA: Diagnosis not present

## 2021-06-13 DIAGNOSIS — K5090 Crohn's disease, unspecified, without complications: Secondary | ICD-10-CM | POA: Diagnosis not present

## 2021-06-13 DIAGNOSIS — I679 Cerebrovascular disease, unspecified: Secondary | ICD-10-CM | POA: Diagnosis not present

## 2021-06-13 DIAGNOSIS — F32A Depression, unspecified: Secondary | ICD-10-CM | POA: Diagnosis not present

## 2021-06-13 DIAGNOSIS — E039 Hypothyroidism, unspecified: Secondary | ICD-10-CM | POA: Diagnosis not present

## 2021-07-06 DIAGNOSIS — I639 Cerebral infarction, unspecified: Secondary | ICD-10-CM | POA: Diagnosis not present

## 2021-07-06 DIAGNOSIS — R2689 Other abnormalities of gait and mobility: Secondary | ICD-10-CM | POA: Diagnosis not present

## 2021-07-07 DIAGNOSIS — R2689 Other abnormalities of gait and mobility: Secondary | ICD-10-CM | POA: Diagnosis not present

## 2021-07-07 DIAGNOSIS — I639 Cerebral infarction, unspecified: Secondary | ICD-10-CM | POA: Diagnosis not present

## 2021-07-10 DIAGNOSIS — R2689 Other abnormalities of gait and mobility: Secondary | ICD-10-CM | POA: Diagnosis not present

## 2021-07-10 DIAGNOSIS — I639 Cerebral infarction, unspecified: Secondary | ICD-10-CM | POA: Diagnosis not present

## 2021-07-11 DIAGNOSIS — H5442A5 Blindness left eye category 5, normal vision right eye: Secondary | ICD-10-CM | POA: Diagnosis not present

## 2021-07-11 DIAGNOSIS — M24541 Contracture, right hand: Secondary | ICD-10-CM | POA: Diagnosis not present

## 2021-07-11 DIAGNOSIS — I639 Cerebral infarction, unspecified: Secondary | ICD-10-CM | POA: Diagnosis not present

## 2021-07-11 DIAGNOSIS — I69351 Hemiplegia and hemiparesis following cerebral infarction affecting right dominant side: Secondary | ICD-10-CM | POA: Diagnosis not present

## 2021-07-11 DIAGNOSIS — M24531 Contracture, right wrist: Secondary | ICD-10-CM | POA: Diagnosis not present

## 2021-07-11 DIAGNOSIS — R2689 Other abnormalities of gait and mobility: Secondary | ICD-10-CM | POA: Diagnosis not present

## 2021-07-11 DIAGNOSIS — H524 Presbyopia: Secondary | ICD-10-CM | POA: Diagnosis not present

## 2021-07-11 DIAGNOSIS — H2513 Age-related nuclear cataract, bilateral: Secondary | ICD-10-CM | POA: Diagnosis not present

## 2021-07-12 DIAGNOSIS — I69351 Hemiplegia and hemiparesis following cerebral infarction affecting right dominant side: Secondary | ICD-10-CM | POA: Diagnosis not present

## 2021-07-12 DIAGNOSIS — R2689 Other abnormalities of gait and mobility: Secondary | ICD-10-CM | POA: Diagnosis not present

## 2021-07-12 DIAGNOSIS — M24541 Contracture, right hand: Secondary | ICD-10-CM | POA: Diagnosis not present

## 2021-07-12 DIAGNOSIS — M24531 Contracture, right wrist: Secondary | ICD-10-CM | POA: Diagnosis not present

## 2021-07-12 DIAGNOSIS — I639 Cerebral infarction, unspecified: Secondary | ICD-10-CM | POA: Diagnosis not present

## 2021-07-13 DIAGNOSIS — M24531 Contracture, right wrist: Secondary | ICD-10-CM | POA: Diagnosis not present

## 2021-07-13 DIAGNOSIS — I639 Cerebral infarction, unspecified: Secondary | ICD-10-CM | POA: Diagnosis not present

## 2021-07-13 DIAGNOSIS — R2689 Other abnormalities of gait and mobility: Secondary | ICD-10-CM | POA: Diagnosis not present

## 2021-07-13 DIAGNOSIS — M24541 Contracture, right hand: Secondary | ICD-10-CM | POA: Diagnosis not present

## 2021-07-13 DIAGNOSIS — I69351 Hemiplegia and hemiparesis following cerebral infarction affecting right dominant side: Secondary | ICD-10-CM | POA: Diagnosis not present

## 2021-07-14 DIAGNOSIS — R2689 Other abnormalities of gait and mobility: Secondary | ICD-10-CM | POA: Diagnosis not present

## 2021-07-14 DIAGNOSIS — M24541 Contracture, right hand: Secondary | ICD-10-CM | POA: Diagnosis not present

## 2021-07-14 DIAGNOSIS — I639 Cerebral infarction, unspecified: Secondary | ICD-10-CM | POA: Diagnosis not present

## 2021-07-14 DIAGNOSIS — M24531 Contracture, right wrist: Secondary | ICD-10-CM | POA: Diagnosis not present

## 2021-07-14 DIAGNOSIS — I69351 Hemiplegia and hemiparesis following cerebral infarction affecting right dominant side: Secondary | ICD-10-CM | POA: Diagnosis not present

## 2021-07-17 DIAGNOSIS — M24541 Contracture, right hand: Secondary | ICD-10-CM | POA: Diagnosis not present

## 2021-07-17 DIAGNOSIS — M24531 Contracture, right wrist: Secondary | ICD-10-CM | POA: Diagnosis not present

## 2021-07-17 DIAGNOSIS — I69351 Hemiplegia and hemiparesis following cerebral infarction affecting right dominant side: Secondary | ICD-10-CM | POA: Diagnosis not present

## 2021-07-17 DIAGNOSIS — I639 Cerebral infarction, unspecified: Secondary | ICD-10-CM | POA: Diagnosis not present

## 2021-07-17 DIAGNOSIS — R2689 Other abnormalities of gait and mobility: Secondary | ICD-10-CM | POA: Diagnosis not present

## 2021-07-18 DIAGNOSIS — F32A Depression, unspecified: Secondary | ICD-10-CM | POA: Diagnosis not present

## 2021-07-18 DIAGNOSIS — I639 Cerebral infarction, unspecified: Secondary | ICD-10-CM | POA: Diagnosis not present

## 2021-07-18 DIAGNOSIS — M24531 Contracture, right wrist: Secondary | ICD-10-CM | POA: Diagnosis not present

## 2021-07-18 DIAGNOSIS — R2689 Other abnormalities of gait and mobility: Secondary | ICD-10-CM | POA: Diagnosis not present

## 2021-07-18 DIAGNOSIS — M24541 Contracture, right hand: Secondary | ICD-10-CM | POA: Diagnosis not present

## 2021-07-18 DIAGNOSIS — I69351 Hemiplegia and hemiparesis following cerebral infarction affecting right dominant side: Secondary | ICD-10-CM | POA: Diagnosis not present

## 2021-07-19 DIAGNOSIS — M24531 Contracture, right wrist: Secondary | ICD-10-CM | POA: Diagnosis not present

## 2021-07-19 DIAGNOSIS — I69351 Hemiplegia and hemiparesis following cerebral infarction affecting right dominant side: Secondary | ICD-10-CM | POA: Diagnosis not present

## 2021-07-19 DIAGNOSIS — M24541 Contracture, right hand: Secondary | ICD-10-CM | POA: Diagnosis not present

## 2021-07-19 DIAGNOSIS — I639 Cerebral infarction, unspecified: Secondary | ICD-10-CM | POA: Diagnosis not present

## 2021-07-19 DIAGNOSIS — R2689 Other abnormalities of gait and mobility: Secondary | ICD-10-CM | POA: Diagnosis not present

## 2021-07-20 DIAGNOSIS — M24541 Contracture, right hand: Secondary | ICD-10-CM | POA: Diagnosis not present

## 2021-07-20 DIAGNOSIS — I69351 Hemiplegia and hemiparesis following cerebral infarction affecting right dominant side: Secondary | ICD-10-CM | POA: Diagnosis not present

## 2021-07-20 DIAGNOSIS — R2689 Other abnormalities of gait and mobility: Secondary | ICD-10-CM | POA: Diagnosis not present

## 2021-07-20 DIAGNOSIS — I639 Cerebral infarction, unspecified: Secondary | ICD-10-CM | POA: Diagnosis not present

## 2021-07-20 DIAGNOSIS — M24531 Contracture, right wrist: Secondary | ICD-10-CM | POA: Diagnosis not present

## 2021-07-21 DIAGNOSIS — I639 Cerebral infarction, unspecified: Secondary | ICD-10-CM | POA: Diagnosis not present

## 2021-07-21 DIAGNOSIS — R2689 Other abnormalities of gait and mobility: Secondary | ICD-10-CM | POA: Diagnosis not present

## 2021-07-21 DIAGNOSIS — M24541 Contracture, right hand: Secondary | ICD-10-CM | POA: Diagnosis not present

## 2021-07-21 DIAGNOSIS — M24531 Contracture, right wrist: Secondary | ICD-10-CM | POA: Diagnosis not present

## 2021-07-21 DIAGNOSIS — I69351 Hemiplegia and hemiparesis following cerebral infarction affecting right dominant side: Secondary | ICD-10-CM | POA: Diagnosis not present

## 2021-07-24 DIAGNOSIS — M24541 Contracture, right hand: Secondary | ICD-10-CM | POA: Diagnosis not present

## 2021-07-24 DIAGNOSIS — I69351 Hemiplegia and hemiparesis following cerebral infarction affecting right dominant side: Secondary | ICD-10-CM | POA: Diagnosis not present

## 2021-07-24 DIAGNOSIS — M24531 Contracture, right wrist: Secondary | ICD-10-CM | POA: Diagnosis not present

## 2021-07-24 DIAGNOSIS — R2689 Other abnormalities of gait and mobility: Secondary | ICD-10-CM | POA: Diagnosis not present

## 2021-07-24 DIAGNOSIS — I639 Cerebral infarction, unspecified: Secondary | ICD-10-CM | POA: Diagnosis not present

## 2021-07-25 DIAGNOSIS — M24541 Contracture, right hand: Secondary | ICD-10-CM | POA: Diagnosis not present

## 2021-07-25 DIAGNOSIS — I69351 Hemiplegia and hemiparesis following cerebral infarction affecting right dominant side: Secondary | ICD-10-CM | POA: Diagnosis not present

## 2021-07-25 DIAGNOSIS — R2689 Other abnormalities of gait and mobility: Secondary | ICD-10-CM | POA: Diagnosis not present

## 2021-07-25 DIAGNOSIS — I639 Cerebral infarction, unspecified: Secondary | ICD-10-CM | POA: Diagnosis not present

## 2021-07-25 DIAGNOSIS — M24531 Contracture, right wrist: Secondary | ICD-10-CM | POA: Diagnosis not present

## 2021-07-26 DIAGNOSIS — I69351 Hemiplegia and hemiparesis following cerebral infarction affecting right dominant side: Secondary | ICD-10-CM | POA: Diagnosis not present

## 2021-07-26 DIAGNOSIS — M24531 Contracture, right wrist: Secondary | ICD-10-CM | POA: Diagnosis not present

## 2021-07-26 DIAGNOSIS — I639 Cerebral infarction, unspecified: Secondary | ICD-10-CM | POA: Diagnosis not present

## 2021-07-26 DIAGNOSIS — R2689 Other abnormalities of gait and mobility: Secondary | ICD-10-CM | POA: Diagnosis not present

## 2021-07-26 DIAGNOSIS — M24541 Contracture, right hand: Secondary | ICD-10-CM | POA: Diagnosis not present

## 2021-07-27 DIAGNOSIS — R2689 Other abnormalities of gait and mobility: Secondary | ICD-10-CM | POA: Diagnosis not present

## 2021-07-27 DIAGNOSIS — M24541 Contracture, right hand: Secondary | ICD-10-CM | POA: Diagnosis not present

## 2021-07-27 DIAGNOSIS — M24531 Contracture, right wrist: Secondary | ICD-10-CM | POA: Diagnosis not present

## 2021-07-27 DIAGNOSIS — I69351 Hemiplegia and hemiparesis following cerebral infarction affecting right dominant side: Secondary | ICD-10-CM | POA: Diagnosis not present

## 2021-07-27 DIAGNOSIS — I639 Cerebral infarction, unspecified: Secondary | ICD-10-CM | POA: Diagnosis not present

## 2021-07-31 DIAGNOSIS — R2689 Other abnormalities of gait and mobility: Secondary | ICD-10-CM | POA: Diagnosis not present

## 2021-07-31 DIAGNOSIS — M24531 Contracture, right wrist: Secondary | ICD-10-CM | POA: Diagnosis not present

## 2021-07-31 DIAGNOSIS — I69351 Hemiplegia and hemiparesis following cerebral infarction affecting right dominant side: Secondary | ICD-10-CM | POA: Diagnosis not present

## 2021-07-31 DIAGNOSIS — M24541 Contracture, right hand: Secondary | ICD-10-CM | POA: Diagnosis not present

## 2021-07-31 DIAGNOSIS — I639 Cerebral infarction, unspecified: Secondary | ICD-10-CM | POA: Diagnosis not present

## 2021-08-01 DIAGNOSIS — M24541 Contracture, right hand: Secondary | ICD-10-CM | POA: Diagnosis not present

## 2021-08-01 DIAGNOSIS — M24531 Contracture, right wrist: Secondary | ICD-10-CM | POA: Diagnosis not present

## 2021-08-01 DIAGNOSIS — I69351 Hemiplegia and hemiparesis following cerebral infarction affecting right dominant side: Secondary | ICD-10-CM | POA: Diagnosis not present

## 2021-08-01 DIAGNOSIS — R2689 Other abnormalities of gait and mobility: Secondary | ICD-10-CM | POA: Diagnosis not present

## 2021-08-01 DIAGNOSIS — I639 Cerebral infarction, unspecified: Secondary | ICD-10-CM | POA: Diagnosis not present

## 2021-08-02 DIAGNOSIS — I639 Cerebral infarction, unspecified: Secondary | ICD-10-CM | POA: Diagnosis not present

## 2021-08-02 DIAGNOSIS — M24541 Contracture, right hand: Secondary | ICD-10-CM | POA: Diagnosis not present

## 2021-08-02 DIAGNOSIS — M24531 Contracture, right wrist: Secondary | ICD-10-CM | POA: Diagnosis not present

## 2021-08-02 DIAGNOSIS — R2689 Other abnormalities of gait and mobility: Secondary | ICD-10-CM | POA: Diagnosis not present

## 2021-08-02 DIAGNOSIS — I69351 Hemiplegia and hemiparesis following cerebral infarction affecting right dominant side: Secondary | ICD-10-CM | POA: Diagnosis not present

## 2021-08-07 DIAGNOSIS — I69351 Hemiplegia and hemiparesis following cerebral infarction affecting right dominant side: Secondary | ICD-10-CM | POA: Diagnosis not present

## 2021-08-07 DIAGNOSIS — I639 Cerebral infarction, unspecified: Secondary | ICD-10-CM | POA: Diagnosis not present

## 2021-08-07 DIAGNOSIS — R2689 Other abnormalities of gait and mobility: Secondary | ICD-10-CM | POA: Diagnosis not present

## 2021-08-07 DIAGNOSIS — M24531 Contracture, right wrist: Secondary | ICD-10-CM | POA: Diagnosis not present

## 2021-08-07 DIAGNOSIS — M24541 Contracture, right hand: Secondary | ICD-10-CM | POA: Diagnosis not present

## 2021-08-08 DIAGNOSIS — I639 Cerebral infarction, unspecified: Secondary | ICD-10-CM | POA: Diagnosis not present

## 2021-08-08 DIAGNOSIS — R2689 Other abnormalities of gait and mobility: Secondary | ICD-10-CM | POA: Diagnosis not present

## 2021-08-08 DIAGNOSIS — M24531 Contracture, right wrist: Secondary | ICD-10-CM | POA: Diagnosis not present

## 2021-08-08 DIAGNOSIS — I69351 Hemiplegia and hemiparesis following cerebral infarction affecting right dominant side: Secondary | ICD-10-CM | POA: Diagnosis not present

## 2021-08-08 DIAGNOSIS — M24541 Contracture, right hand: Secondary | ICD-10-CM | POA: Diagnosis not present

## 2021-08-09 DIAGNOSIS — R2689 Other abnormalities of gait and mobility: Secondary | ICD-10-CM | POA: Diagnosis not present

## 2021-08-09 DIAGNOSIS — I69351 Hemiplegia and hemiparesis following cerebral infarction affecting right dominant side: Secondary | ICD-10-CM | POA: Diagnosis not present

## 2021-08-09 DIAGNOSIS — M24531 Contracture, right wrist: Secondary | ICD-10-CM | POA: Diagnosis not present

## 2021-08-09 DIAGNOSIS — I639 Cerebral infarction, unspecified: Secondary | ICD-10-CM | POA: Diagnosis not present

## 2021-08-09 DIAGNOSIS — M24541 Contracture, right hand: Secondary | ICD-10-CM | POA: Diagnosis not present

## 2021-08-10 DIAGNOSIS — I69351 Hemiplegia and hemiparesis following cerebral infarction affecting right dominant side: Secondary | ICD-10-CM | POA: Diagnosis not present

## 2021-08-10 DIAGNOSIS — I639 Cerebral infarction, unspecified: Secondary | ICD-10-CM | POA: Diagnosis not present

## 2021-08-10 DIAGNOSIS — R2689 Other abnormalities of gait and mobility: Secondary | ICD-10-CM | POA: Diagnosis not present

## 2021-08-10 DIAGNOSIS — M24531 Contracture, right wrist: Secondary | ICD-10-CM | POA: Diagnosis not present

## 2021-08-10 DIAGNOSIS — M24541 Contracture, right hand: Secondary | ICD-10-CM | POA: Diagnosis not present

## 2021-08-11 DIAGNOSIS — R2689 Other abnormalities of gait and mobility: Secondary | ICD-10-CM | POA: Diagnosis not present

## 2021-08-11 DIAGNOSIS — I639 Cerebral infarction, unspecified: Secondary | ICD-10-CM | POA: Diagnosis not present

## 2021-08-11 DIAGNOSIS — M24531 Contracture, right wrist: Secondary | ICD-10-CM | POA: Diagnosis not present

## 2021-08-11 DIAGNOSIS — I69351 Hemiplegia and hemiparesis following cerebral infarction affecting right dominant side: Secondary | ICD-10-CM | POA: Diagnosis not present

## 2021-08-11 DIAGNOSIS — M24541 Contracture, right hand: Secondary | ICD-10-CM | POA: Diagnosis not present

## 2021-08-15 DIAGNOSIS — I69351 Hemiplegia and hemiparesis following cerebral infarction affecting right dominant side: Secondary | ICD-10-CM | POA: Diagnosis not present

## 2021-08-15 DIAGNOSIS — I639 Cerebral infarction, unspecified: Secondary | ICD-10-CM | POA: Diagnosis not present

## 2021-08-15 DIAGNOSIS — M24531 Contracture, right wrist: Secondary | ICD-10-CM | POA: Diagnosis not present

## 2021-08-15 DIAGNOSIS — R2689 Other abnormalities of gait and mobility: Secondary | ICD-10-CM | POA: Diagnosis not present

## 2021-08-15 DIAGNOSIS — M24541 Contracture, right hand: Secondary | ICD-10-CM | POA: Diagnosis not present

## 2021-08-16 DIAGNOSIS — R2689 Other abnormalities of gait and mobility: Secondary | ICD-10-CM | POA: Diagnosis not present

## 2021-08-16 DIAGNOSIS — I69351 Hemiplegia and hemiparesis following cerebral infarction affecting right dominant side: Secondary | ICD-10-CM | POA: Diagnosis not present

## 2021-08-16 DIAGNOSIS — M24531 Contracture, right wrist: Secondary | ICD-10-CM | POA: Diagnosis not present

## 2021-08-16 DIAGNOSIS — I639 Cerebral infarction, unspecified: Secondary | ICD-10-CM | POA: Diagnosis not present

## 2021-08-16 DIAGNOSIS — M24541 Contracture, right hand: Secondary | ICD-10-CM | POA: Diagnosis not present

## 2021-08-17 DIAGNOSIS — R2689 Other abnormalities of gait and mobility: Secondary | ICD-10-CM | POA: Diagnosis not present

## 2021-08-17 DIAGNOSIS — I639 Cerebral infarction, unspecified: Secondary | ICD-10-CM | POA: Diagnosis not present

## 2021-08-17 DIAGNOSIS — M24541 Contracture, right hand: Secondary | ICD-10-CM | POA: Diagnosis not present

## 2021-08-17 DIAGNOSIS — M24531 Contracture, right wrist: Secondary | ICD-10-CM | POA: Diagnosis not present

## 2021-08-17 DIAGNOSIS — I69351 Hemiplegia and hemiparesis following cerebral infarction affecting right dominant side: Secondary | ICD-10-CM | POA: Diagnosis not present

## 2021-08-18 DIAGNOSIS — M24541 Contracture, right hand: Secondary | ICD-10-CM | POA: Diagnosis not present

## 2021-08-18 DIAGNOSIS — R2689 Other abnormalities of gait and mobility: Secondary | ICD-10-CM | POA: Diagnosis not present

## 2021-08-18 DIAGNOSIS — I69351 Hemiplegia and hemiparesis following cerebral infarction affecting right dominant side: Secondary | ICD-10-CM | POA: Diagnosis not present

## 2021-08-18 DIAGNOSIS — I639 Cerebral infarction, unspecified: Secondary | ICD-10-CM | POA: Diagnosis not present

## 2021-08-18 DIAGNOSIS — M24531 Contracture, right wrist: Secondary | ICD-10-CM | POA: Diagnosis not present

## 2021-08-21 DIAGNOSIS — I69351 Hemiplegia and hemiparesis following cerebral infarction affecting right dominant side: Secondary | ICD-10-CM | POA: Diagnosis not present

## 2021-08-21 DIAGNOSIS — I639 Cerebral infarction, unspecified: Secondary | ICD-10-CM | POA: Diagnosis not present

## 2021-08-21 DIAGNOSIS — R2689 Other abnormalities of gait and mobility: Secondary | ICD-10-CM | POA: Diagnosis not present

## 2021-08-21 DIAGNOSIS — M24531 Contracture, right wrist: Secondary | ICD-10-CM | POA: Diagnosis not present

## 2021-08-21 DIAGNOSIS — M24541 Contracture, right hand: Secondary | ICD-10-CM | POA: Diagnosis not present

## 2021-08-22 DIAGNOSIS — R2689 Other abnormalities of gait and mobility: Secondary | ICD-10-CM | POA: Diagnosis not present

## 2021-08-22 DIAGNOSIS — I639 Cerebral infarction, unspecified: Secondary | ICD-10-CM | POA: Diagnosis not present

## 2021-08-22 DIAGNOSIS — M24531 Contracture, right wrist: Secondary | ICD-10-CM | POA: Diagnosis not present

## 2021-08-22 DIAGNOSIS — M24541 Contracture, right hand: Secondary | ICD-10-CM | POA: Diagnosis not present

## 2021-08-22 DIAGNOSIS — I69351 Hemiplegia and hemiparesis following cerebral infarction affecting right dominant side: Secondary | ICD-10-CM | POA: Diagnosis not present

## 2021-08-23 DIAGNOSIS — I69351 Hemiplegia and hemiparesis following cerebral infarction affecting right dominant side: Secondary | ICD-10-CM | POA: Diagnosis not present

## 2021-08-23 DIAGNOSIS — K5090 Crohn's disease, unspecified, without complications: Secondary | ICD-10-CM | POA: Diagnosis not present

## 2021-08-23 DIAGNOSIS — E039 Hypothyroidism, unspecified: Secondary | ICD-10-CM | POA: Diagnosis not present

## 2021-08-23 DIAGNOSIS — G40909 Epilepsy, unspecified, not intractable, without status epilepticus: Secondary | ICD-10-CM | POA: Diagnosis not present

## 2021-08-23 DIAGNOSIS — I639 Cerebral infarction, unspecified: Secondary | ICD-10-CM | POA: Diagnosis not present

## 2021-08-23 DIAGNOSIS — M24541 Contracture, right hand: Secondary | ICD-10-CM | POA: Diagnosis not present

## 2021-08-23 DIAGNOSIS — M24531 Contracture, right wrist: Secondary | ICD-10-CM | POA: Diagnosis not present

## 2021-08-23 DIAGNOSIS — R2689 Other abnormalities of gait and mobility: Secondary | ICD-10-CM | POA: Diagnosis not present

## 2021-08-24 DIAGNOSIS — M24531 Contracture, right wrist: Secondary | ICD-10-CM | POA: Diagnosis not present

## 2021-08-24 DIAGNOSIS — R471 Dysarthria and anarthria: Secondary | ICD-10-CM | POA: Diagnosis not present

## 2021-08-24 DIAGNOSIS — R2689 Other abnormalities of gait and mobility: Secondary | ICD-10-CM | POA: Diagnosis not present

## 2021-08-24 DIAGNOSIS — R32 Unspecified urinary incontinence: Secondary | ICD-10-CM | POA: Diagnosis not present

## 2021-08-24 DIAGNOSIS — R569 Unspecified convulsions: Secondary | ICD-10-CM | POA: Diagnosis not present

## 2021-08-24 DIAGNOSIS — I639 Cerebral infarction, unspecified: Secondary | ICD-10-CM | POA: Diagnosis not present

## 2021-08-24 DIAGNOSIS — E039 Hypothyroidism, unspecified: Secondary | ICD-10-CM | POA: Diagnosis not present

## 2021-08-24 DIAGNOSIS — I679 Cerebrovascular disease, unspecified: Secondary | ICD-10-CM | POA: Diagnosis not present

## 2021-08-24 DIAGNOSIS — I69351 Hemiplegia and hemiparesis following cerebral infarction affecting right dominant side: Secondary | ICD-10-CM | POA: Diagnosis not present

## 2021-08-24 DIAGNOSIS — Z7409 Other reduced mobility: Secondary | ICD-10-CM | POA: Diagnosis not present

## 2021-08-24 DIAGNOSIS — M24541 Contracture, right hand: Secondary | ICD-10-CM | POA: Diagnosis not present

## 2021-08-25 DIAGNOSIS — M24541 Contracture, right hand: Secondary | ICD-10-CM | POA: Diagnosis not present

## 2021-08-25 DIAGNOSIS — R2689 Other abnormalities of gait and mobility: Secondary | ICD-10-CM | POA: Diagnosis not present

## 2021-08-25 DIAGNOSIS — I639 Cerebral infarction, unspecified: Secondary | ICD-10-CM | POA: Diagnosis not present

## 2021-08-25 DIAGNOSIS — M24531 Contracture, right wrist: Secondary | ICD-10-CM | POA: Diagnosis not present

## 2021-08-25 DIAGNOSIS — I69351 Hemiplegia and hemiparesis following cerebral infarction affecting right dominant side: Secondary | ICD-10-CM | POA: Diagnosis not present

## 2021-08-28 DIAGNOSIS — R2689 Other abnormalities of gait and mobility: Secondary | ICD-10-CM | POA: Diagnosis not present

## 2021-08-28 DIAGNOSIS — M24531 Contracture, right wrist: Secondary | ICD-10-CM | POA: Diagnosis not present

## 2021-08-28 DIAGNOSIS — M24541 Contracture, right hand: Secondary | ICD-10-CM | POA: Diagnosis not present

## 2021-08-28 DIAGNOSIS — I69351 Hemiplegia and hemiparesis following cerebral infarction affecting right dominant side: Secondary | ICD-10-CM | POA: Diagnosis not present

## 2021-08-28 DIAGNOSIS — I639 Cerebral infarction, unspecified: Secondary | ICD-10-CM | POA: Diagnosis not present

## 2021-09-08 DIAGNOSIS — R070 Pain in throat: Secondary | ICD-10-CM | POA: Diagnosis not present

## 2021-09-11 DIAGNOSIS — U071 COVID-19: Secondary | ICD-10-CM | POA: Diagnosis not present

## 2021-09-12 DIAGNOSIS — U071 COVID-19: Secondary | ICD-10-CM | POA: Diagnosis not present

## 2021-09-19 DIAGNOSIS — U071 COVID-19: Secondary | ICD-10-CM | POA: Diagnosis not present

## 2021-09-20 DIAGNOSIS — E559 Vitamin D deficiency, unspecified: Secondary | ICD-10-CM | POA: Diagnosis not present

## 2021-10-12 DIAGNOSIS — M2041 Other hammer toe(s) (acquired), right foot: Secondary | ICD-10-CM | POA: Diagnosis not present

## 2021-10-12 DIAGNOSIS — B351 Tinea unguium: Secondary | ICD-10-CM | POA: Diagnosis not present

## 2021-10-12 DIAGNOSIS — I7091 Generalized atherosclerosis: Secondary | ICD-10-CM | POA: Diagnosis not present

## 2021-10-24 DIAGNOSIS — E559 Vitamin D deficiency, unspecified: Secondary | ICD-10-CM | POA: Diagnosis not present

## 2021-10-24 DIAGNOSIS — R569 Unspecified convulsions: Secondary | ICD-10-CM | POA: Diagnosis not present

## 2021-10-24 DIAGNOSIS — K5090 Crohn's disease, unspecified, without complications: Secondary | ICD-10-CM | POA: Diagnosis not present

## 2021-10-24 DIAGNOSIS — F32A Depression, unspecified: Secondary | ICD-10-CM | POA: Diagnosis not present

## 2021-10-24 DIAGNOSIS — E039 Hypothyroidism, unspecified: Secondary | ICD-10-CM | POA: Diagnosis not present

## 2021-10-24 DIAGNOSIS — I679 Cerebrovascular disease, unspecified: Secondary | ICD-10-CM | POA: Diagnosis not present

## 2021-11-18 DIAGNOSIS — R471 Dysarthria and anarthria: Secondary | ICD-10-CM | POA: Diagnosis not present

## 2021-11-18 DIAGNOSIS — Z7409 Other reduced mobility: Secondary | ICD-10-CM | POA: Diagnosis not present

## 2021-11-18 DIAGNOSIS — R569 Unspecified convulsions: Secondary | ICD-10-CM | POA: Diagnosis not present

## 2021-11-18 DIAGNOSIS — E039 Hypothyroidism, unspecified: Secondary | ICD-10-CM | POA: Diagnosis not present

## 2021-11-18 DIAGNOSIS — K5090 Crohn's disease, unspecified, without complications: Secondary | ICD-10-CM | POA: Diagnosis not present

## 2021-11-18 DIAGNOSIS — R32 Unspecified urinary incontinence: Secondary | ICD-10-CM | POA: Diagnosis not present

## 2021-11-29 DIAGNOSIS — I639 Cerebral infarction, unspecified: Secondary | ICD-10-CM | POA: Diagnosis not present

## 2021-11-29 DIAGNOSIS — R2689 Other abnormalities of gait and mobility: Secondary | ICD-10-CM | POA: Diagnosis not present

## 2021-11-29 DIAGNOSIS — E039 Hypothyroidism, unspecified: Secondary | ICD-10-CM | POA: Diagnosis not present

## 2021-11-30 DIAGNOSIS — R2689 Other abnormalities of gait and mobility: Secondary | ICD-10-CM | POA: Diagnosis not present

## 2021-11-30 DIAGNOSIS — I639 Cerebral infarction, unspecified: Secondary | ICD-10-CM | POA: Diagnosis not present

## 2021-12-06 DIAGNOSIS — I639 Cerebral infarction, unspecified: Secondary | ICD-10-CM | POA: Diagnosis not present

## 2021-12-06 DIAGNOSIS — Z741 Need for assistance with personal care: Secondary | ICD-10-CM | POA: Diagnosis not present

## 2021-12-06 DIAGNOSIS — R262 Difficulty in walking, not elsewhere classified: Secondary | ICD-10-CM | POA: Diagnosis not present

## 2021-12-06 DIAGNOSIS — R2689 Other abnormalities of gait and mobility: Secondary | ICD-10-CM | POA: Diagnosis not present

## 2021-12-06 DIAGNOSIS — I69351 Hemiplegia and hemiparesis following cerebral infarction affecting right dominant side: Secondary | ICD-10-CM | POA: Diagnosis not present

## 2021-12-07 DIAGNOSIS — R2689 Other abnormalities of gait and mobility: Secondary | ICD-10-CM | POA: Diagnosis not present

## 2021-12-07 DIAGNOSIS — R262 Difficulty in walking, not elsewhere classified: Secondary | ICD-10-CM | POA: Diagnosis not present

## 2021-12-07 DIAGNOSIS — I69351 Hemiplegia and hemiparesis following cerebral infarction affecting right dominant side: Secondary | ICD-10-CM | POA: Diagnosis not present

## 2021-12-07 DIAGNOSIS — Z741 Need for assistance with personal care: Secondary | ICD-10-CM | POA: Diagnosis not present

## 2021-12-07 DIAGNOSIS — I639 Cerebral infarction, unspecified: Secondary | ICD-10-CM | POA: Diagnosis not present

## 2021-12-08 DIAGNOSIS — R262 Difficulty in walking, not elsewhere classified: Secondary | ICD-10-CM | POA: Diagnosis not present

## 2021-12-08 DIAGNOSIS — I639 Cerebral infarction, unspecified: Secondary | ICD-10-CM | POA: Diagnosis not present

## 2021-12-08 DIAGNOSIS — I69351 Hemiplegia and hemiparesis following cerebral infarction affecting right dominant side: Secondary | ICD-10-CM | POA: Diagnosis not present

## 2021-12-08 DIAGNOSIS — Z741 Need for assistance with personal care: Secondary | ICD-10-CM | POA: Diagnosis not present

## 2021-12-08 DIAGNOSIS — R2689 Other abnormalities of gait and mobility: Secondary | ICD-10-CM | POA: Diagnosis not present

## 2021-12-11 DIAGNOSIS — R2689 Other abnormalities of gait and mobility: Secondary | ICD-10-CM | POA: Diagnosis not present

## 2021-12-11 DIAGNOSIS — Z741 Need for assistance with personal care: Secondary | ICD-10-CM | POA: Diagnosis not present

## 2021-12-11 DIAGNOSIS — R262 Difficulty in walking, not elsewhere classified: Secondary | ICD-10-CM | POA: Diagnosis not present

## 2021-12-11 DIAGNOSIS — I69351 Hemiplegia and hemiparesis following cerebral infarction affecting right dominant side: Secondary | ICD-10-CM | POA: Diagnosis not present

## 2021-12-11 DIAGNOSIS — I639 Cerebral infarction, unspecified: Secondary | ICD-10-CM | POA: Diagnosis not present

## 2021-12-13 DIAGNOSIS — Z741 Need for assistance with personal care: Secondary | ICD-10-CM | POA: Diagnosis not present

## 2021-12-13 DIAGNOSIS — I639 Cerebral infarction, unspecified: Secondary | ICD-10-CM | POA: Diagnosis not present

## 2021-12-13 DIAGNOSIS — I69351 Hemiplegia and hemiparesis following cerebral infarction affecting right dominant side: Secondary | ICD-10-CM | POA: Diagnosis not present

## 2021-12-13 DIAGNOSIS — R262 Difficulty in walking, not elsewhere classified: Secondary | ICD-10-CM | POA: Diagnosis not present

## 2021-12-13 DIAGNOSIS — R2689 Other abnormalities of gait and mobility: Secondary | ICD-10-CM | POA: Diagnosis not present

## 2021-12-14 DIAGNOSIS — R2689 Other abnormalities of gait and mobility: Secondary | ICD-10-CM | POA: Diagnosis not present

## 2021-12-14 DIAGNOSIS — R262 Difficulty in walking, not elsewhere classified: Secondary | ICD-10-CM | POA: Diagnosis not present

## 2021-12-14 DIAGNOSIS — Z741 Need for assistance with personal care: Secondary | ICD-10-CM | POA: Diagnosis not present

## 2021-12-14 DIAGNOSIS — I639 Cerebral infarction, unspecified: Secondary | ICD-10-CM | POA: Diagnosis not present

## 2021-12-14 DIAGNOSIS — I69351 Hemiplegia and hemiparesis following cerebral infarction affecting right dominant side: Secondary | ICD-10-CM | POA: Diagnosis not present

## 2021-12-20 DIAGNOSIS — I639 Cerebral infarction, unspecified: Secondary | ICD-10-CM | POA: Diagnosis not present

## 2021-12-20 DIAGNOSIS — I69351 Hemiplegia and hemiparesis following cerebral infarction affecting right dominant side: Secondary | ICD-10-CM | POA: Diagnosis not present

## 2021-12-20 DIAGNOSIS — M24531 Contracture, right wrist: Secondary | ICD-10-CM | POA: Diagnosis not present

## 2021-12-20 DIAGNOSIS — M24541 Contracture, right hand: Secondary | ICD-10-CM | POA: Diagnosis not present

## 2021-12-20 DIAGNOSIS — R2689 Other abnormalities of gait and mobility: Secondary | ICD-10-CM | POA: Diagnosis not present

## 2021-12-21 DIAGNOSIS — R262 Difficulty in walking, not elsewhere classified: Secondary | ICD-10-CM | POA: Diagnosis not present

## 2021-12-21 DIAGNOSIS — Z741 Need for assistance with personal care: Secondary | ICD-10-CM | POA: Diagnosis not present

## 2021-12-21 DIAGNOSIS — I69351 Hemiplegia and hemiparesis following cerebral infarction affecting right dominant side: Secondary | ICD-10-CM | POA: Diagnosis not present

## 2021-12-21 DIAGNOSIS — R2689 Other abnormalities of gait and mobility: Secondary | ICD-10-CM | POA: Diagnosis not present

## 2021-12-21 DIAGNOSIS — I639 Cerebral infarction, unspecified: Secondary | ICD-10-CM | POA: Diagnosis not present

## 2021-12-22 DIAGNOSIS — R262 Difficulty in walking, not elsewhere classified: Secondary | ICD-10-CM | POA: Diagnosis not present

## 2021-12-22 DIAGNOSIS — I69351 Hemiplegia and hemiparesis following cerebral infarction affecting right dominant side: Secondary | ICD-10-CM | POA: Diagnosis not present

## 2021-12-22 DIAGNOSIS — Z741 Need for assistance with personal care: Secondary | ICD-10-CM | POA: Diagnosis not present

## 2021-12-22 DIAGNOSIS — R2689 Other abnormalities of gait and mobility: Secondary | ICD-10-CM | POA: Diagnosis not present

## 2021-12-22 DIAGNOSIS — I639 Cerebral infarction, unspecified: Secondary | ICD-10-CM | POA: Diagnosis not present

## 2021-12-25 DIAGNOSIS — M24541 Contracture, right hand: Secondary | ICD-10-CM | POA: Diagnosis not present

## 2021-12-25 DIAGNOSIS — I639 Cerebral infarction, unspecified: Secondary | ICD-10-CM | POA: Diagnosis not present

## 2021-12-25 DIAGNOSIS — R262 Difficulty in walking, not elsewhere classified: Secondary | ICD-10-CM | POA: Diagnosis not present

## 2021-12-25 DIAGNOSIS — I69351 Hemiplegia and hemiparesis following cerebral infarction affecting right dominant side: Secondary | ICD-10-CM | POA: Diagnosis not present

## 2021-12-25 DIAGNOSIS — M24531 Contracture, right wrist: Secondary | ICD-10-CM | POA: Diagnosis not present

## 2021-12-25 DIAGNOSIS — R2689 Other abnormalities of gait and mobility: Secondary | ICD-10-CM | POA: Diagnosis not present

## 2021-12-25 DIAGNOSIS — Z741 Need for assistance with personal care: Secondary | ICD-10-CM | POA: Diagnosis not present

## 2021-12-26 DIAGNOSIS — R2689 Other abnormalities of gait and mobility: Secondary | ICD-10-CM | POA: Diagnosis not present

## 2021-12-26 DIAGNOSIS — I639 Cerebral infarction, unspecified: Secondary | ICD-10-CM | POA: Diagnosis not present

## 2021-12-26 DIAGNOSIS — Z741 Need for assistance with personal care: Secondary | ICD-10-CM | POA: Diagnosis not present

## 2021-12-26 DIAGNOSIS — I69351 Hemiplegia and hemiparesis following cerebral infarction affecting right dominant side: Secondary | ICD-10-CM | POA: Diagnosis not present

## 2021-12-26 DIAGNOSIS — R262 Difficulty in walking, not elsewhere classified: Secondary | ICD-10-CM | POA: Diagnosis not present

## 2021-12-27 DIAGNOSIS — I69351 Hemiplegia and hemiparesis following cerebral infarction affecting right dominant side: Secondary | ICD-10-CM | POA: Diagnosis not present

## 2021-12-27 DIAGNOSIS — R2689 Other abnormalities of gait and mobility: Secondary | ICD-10-CM | POA: Diagnosis not present

## 2021-12-27 DIAGNOSIS — Z741 Need for assistance with personal care: Secondary | ICD-10-CM | POA: Diagnosis not present

## 2021-12-27 DIAGNOSIS — I639 Cerebral infarction, unspecified: Secondary | ICD-10-CM | POA: Diagnosis not present

## 2021-12-27 DIAGNOSIS — R262 Difficulty in walking, not elsewhere classified: Secondary | ICD-10-CM | POA: Diagnosis not present

## 2021-12-28 DIAGNOSIS — M19012 Primary osteoarthritis, left shoulder: Secondary | ICD-10-CM | POA: Diagnosis not present

## 2021-12-28 DIAGNOSIS — Z741 Need for assistance with personal care: Secondary | ICD-10-CM | POA: Diagnosis not present

## 2021-12-28 DIAGNOSIS — R262 Difficulty in walking, not elsewhere classified: Secondary | ICD-10-CM | POA: Diagnosis not present

## 2021-12-28 DIAGNOSIS — R2689 Other abnormalities of gait and mobility: Secondary | ICD-10-CM | POA: Diagnosis not present

## 2021-12-28 DIAGNOSIS — I69351 Hemiplegia and hemiparesis following cerebral infarction affecting right dominant side: Secondary | ICD-10-CM | POA: Diagnosis not present

## 2021-12-28 DIAGNOSIS — I639 Cerebral infarction, unspecified: Secondary | ICD-10-CM | POA: Diagnosis not present

## 2022-01-01 DIAGNOSIS — M24541 Contracture, right hand: Secondary | ICD-10-CM | POA: Diagnosis not present

## 2022-01-01 DIAGNOSIS — Z741 Need for assistance with personal care: Secondary | ICD-10-CM | POA: Diagnosis not present

## 2022-01-01 DIAGNOSIS — R262 Difficulty in walking, not elsewhere classified: Secondary | ICD-10-CM | POA: Diagnosis not present

## 2022-01-01 DIAGNOSIS — R2689 Other abnormalities of gait and mobility: Secondary | ICD-10-CM | POA: Diagnosis not present

## 2022-01-01 DIAGNOSIS — I69351 Hemiplegia and hemiparesis following cerebral infarction affecting right dominant side: Secondary | ICD-10-CM | POA: Diagnosis not present

## 2022-01-01 DIAGNOSIS — I639 Cerebral infarction, unspecified: Secondary | ICD-10-CM | POA: Diagnosis not present

## 2022-01-01 DIAGNOSIS — M24531 Contracture, right wrist: Secondary | ICD-10-CM | POA: Diagnosis not present

## 2022-01-02 DIAGNOSIS — M19012 Primary osteoarthritis, left shoulder: Secondary | ICD-10-CM | POA: Diagnosis not present

## 2022-01-03 DIAGNOSIS — I639 Cerebral infarction, unspecified: Secondary | ICD-10-CM | POA: Diagnosis not present

## 2022-01-03 DIAGNOSIS — Z741 Need for assistance with personal care: Secondary | ICD-10-CM | POA: Diagnosis not present

## 2022-01-03 DIAGNOSIS — R2689 Other abnormalities of gait and mobility: Secondary | ICD-10-CM | POA: Diagnosis not present

## 2022-01-03 DIAGNOSIS — R262 Difficulty in walking, not elsewhere classified: Secondary | ICD-10-CM | POA: Diagnosis not present

## 2022-01-03 DIAGNOSIS — I69351 Hemiplegia and hemiparesis following cerebral infarction affecting right dominant side: Secondary | ICD-10-CM | POA: Diagnosis not present

## 2022-01-04 DIAGNOSIS — R2689 Other abnormalities of gait and mobility: Secondary | ICD-10-CM | POA: Diagnosis not present

## 2022-01-04 DIAGNOSIS — R262 Difficulty in walking, not elsewhere classified: Secondary | ICD-10-CM | POA: Diagnosis not present

## 2022-01-04 DIAGNOSIS — Z741 Need for assistance with personal care: Secondary | ICD-10-CM | POA: Diagnosis not present

## 2022-01-04 DIAGNOSIS — I639 Cerebral infarction, unspecified: Secondary | ICD-10-CM | POA: Diagnosis not present

## 2022-01-04 DIAGNOSIS — I69351 Hemiplegia and hemiparesis following cerebral infarction affecting right dominant side: Secondary | ICD-10-CM | POA: Diagnosis not present

## 2022-01-05 DIAGNOSIS — G64 Other disorders of peripheral nervous system: Secondary | ICD-10-CM | POA: Diagnosis not present

## 2022-01-07 DIAGNOSIS — R059 Cough, unspecified: Secondary | ICD-10-CM | POA: Diagnosis not present

## 2022-01-08 DIAGNOSIS — I69351 Hemiplegia and hemiparesis following cerebral infarction affecting right dominant side: Secondary | ICD-10-CM | POA: Diagnosis not present

## 2022-01-08 DIAGNOSIS — Z741 Need for assistance with personal care: Secondary | ICD-10-CM | POA: Diagnosis not present

## 2022-01-08 DIAGNOSIS — I639 Cerebral infarction, unspecified: Secondary | ICD-10-CM | POA: Diagnosis not present

## 2022-01-08 DIAGNOSIS — M24541 Contracture, right hand: Secondary | ICD-10-CM | POA: Diagnosis not present

## 2022-01-08 DIAGNOSIS — M24531 Contracture, right wrist: Secondary | ICD-10-CM | POA: Diagnosis not present

## 2022-01-08 DIAGNOSIS — R2689 Other abnormalities of gait and mobility: Secondary | ICD-10-CM | POA: Diagnosis not present

## 2022-01-08 DIAGNOSIS — R262 Difficulty in walking, not elsewhere classified: Secondary | ICD-10-CM | POA: Diagnosis not present

## 2022-01-09 DIAGNOSIS — I69351 Hemiplegia and hemiparesis following cerebral infarction affecting right dominant side: Secondary | ICD-10-CM | POA: Diagnosis not present

## 2022-01-09 DIAGNOSIS — I639 Cerebral infarction, unspecified: Secondary | ICD-10-CM | POA: Diagnosis not present

## 2022-01-09 DIAGNOSIS — R262 Difficulty in walking, not elsewhere classified: Secondary | ICD-10-CM | POA: Diagnosis not present

## 2022-01-09 DIAGNOSIS — Z741 Need for assistance with personal care: Secondary | ICD-10-CM | POA: Diagnosis not present

## 2022-01-10 DIAGNOSIS — I69351 Hemiplegia and hemiparesis following cerebral infarction affecting right dominant side: Secondary | ICD-10-CM | POA: Diagnosis not present

## 2022-01-10 DIAGNOSIS — Z741 Need for assistance with personal care: Secondary | ICD-10-CM | POA: Diagnosis not present

## 2022-01-10 DIAGNOSIS — R262 Difficulty in walking, not elsewhere classified: Secondary | ICD-10-CM | POA: Diagnosis not present

## 2022-01-10 DIAGNOSIS — I639 Cerebral infarction, unspecified: Secondary | ICD-10-CM | POA: Diagnosis not present

## 2022-01-11 DIAGNOSIS — I639 Cerebral infarction, unspecified: Secondary | ICD-10-CM | POA: Diagnosis not present

## 2022-01-11 DIAGNOSIS — R262 Difficulty in walking, not elsewhere classified: Secondary | ICD-10-CM | POA: Diagnosis not present

## 2022-01-11 DIAGNOSIS — I69351 Hemiplegia and hemiparesis following cerebral infarction affecting right dominant side: Secondary | ICD-10-CM | POA: Diagnosis not present

## 2022-01-11 DIAGNOSIS — Z741 Need for assistance with personal care: Secondary | ICD-10-CM | POA: Diagnosis not present

## 2022-01-15 DIAGNOSIS — I639 Cerebral infarction, unspecified: Secondary | ICD-10-CM | POA: Diagnosis not present

## 2022-01-15 DIAGNOSIS — M24531 Contracture, right wrist: Secondary | ICD-10-CM | POA: Diagnosis not present

## 2022-01-15 DIAGNOSIS — R2689 Other abnormalities of gait and mobility: Secondary | ICD-10-CM | POA: Diagnosis not present

## 2022-01-15 DIAGNOSIS — I69351 Hemiplegia and hemiparesis following cerebral infarction affecting right dominant side: Secondary | ICD-10-CM | POA: Diagnosis not present

## 2022-01-15 DIAGNOSIS — M24541 Contracture, right hand: Secondary | ICD-10-CM | POA: Diagnosis not present

## 2022-01-16 DIAGNOSIS — I69351 Hemiplegia and hemiparesis following cerebral infarction affecting right dominant side: Secondary | ICD-10-CM | POA: Diagnosis not present

## 2022-01-16 DIAGNOSIS — R262 Difficulty in walking, not elsewhere classified: Secondary | ICD-10-CM | POA: Diagnosis not present

## 2022-01-16 DIAGNOSIS — Z741 Need for assistance with personal care: Secondary | ICD-10-CM | POA: Diagnosis not present

## 2022-01-16 DIAGNOSIS — I639 Cerebral infarction, unspecified: Secondary | ICD-10-CM | POA: Diagnosis not present

## 2022-01-17 DIAGNOSIS — I69351 Hemiplegia and hemiparesis following cerebral infarction affecting right dominant side: Secondary | ICD-10-CM | POA: Diagnosis not present

## 2022-01-17 DIAGNOSIS — Z741 Need for assistance with personal care: Secondary | ICD-10-CM | POA: Diagnosis not present

## 2022-01-17 DIAGNOSIS — R262 Difficulty in walking, not elsewhere classified: Secondary | ICD-10-CM | POA: Diagnosis not present

## 2022-01-17 DIAGNOSIS — I639 Cerebral infarction, unspecified: Secondary | ICD-10-CM | POA: Diagnosis not present

## 2022-01-18 DIAGNOSIS — I639 Cerebral infarction, unspecified: Secondary | ICD-10-CM | POA: Diagnosis not present

## 2022-01-18 DIAGNOSIS — R262 Difficulty in walking, not elsewhere classified: Secondary | ICD-10-CM | POA: Diagnosis not present

## 2022-01-18 DIAGNOSIS — I69351 Hemiplegia and hemiparesis following cerebral infarction affecting right dominant side: Secondary | ICD-10-CM | POA: Diagnosis not present

## 2022-01-18 DIAGNOSIS — Z741 Need for assistance with personal care: Secondary | ICD-10-CM | POA: Diagnosis not present

## 2022-01-19 DIAGNOSIS — R262 Difficulty in walking, not elsewhere classified: Secondary | ICD-10-CM | POA: Diagnosis not present

## 2022-01-19 DIAGNOSIS — I69351 Hemiplegia and hemiparesis following cerebral infarction affecting right dominant side: Secondary | ICD-10-CM | POA: Diagnosis not present

## 2022-01-19 DIAGNOSIS — I639 Cerebral infarction, unspecified: Secondary | ICD-10-CM | POA: Diagnosis not present

## 2022-01-19 DIAGNOSIS — Z741 Need for assistance with personal care: Secondary | ICD-10-CM | POA: Diagnosis not present

## 2022-01-22 DIAGNOSIS — R262 Difficulty in walking, not elsewhere classified: Secondary | ICD-10-CM | POA: Diagnosis not present

## 2022-01-22 DIAGNOSIS — Z741 Need for assistance with personal care: Secondary | ICD-10-CM | POA: Diagnosis not present

## 2022-01-22 DIAGNOSIS — I639 Cerebral infarction, unspecified: Secondary | ICD-10-CM | POA: Diagnosis not present

## 2022-01-22 DIAGNOSIS — R2689 Other abnormalities of gait and mobility: Secondary | ICD-10-CM | POA: Diagnosis not present

## 2022-01-22 DIAGNOSIS — M24531 Contracture, right wrist: Secondary | ICD-10-CM | POA: Diagnosis not present

## 2022-01-22 DIAGNOSIS — M24541 Contracture, right hand: Secondary | ICD-10-CM | POA: Diagnosis not present

## 2022-01-22 DIAGNOSIS — I69351 Hemiplegia and hemiparesis following cerebral infarction affecting right dominant side: Secondary | ICD-10-CM | POA: Diagnosis not present

## 2022-01-23 DIAGNOSIS — I69351 Hemiplegia and hemiparesis following cerebral infarction affecting right dominant side: Secondary | ICD-10-CM | POA: Diagnosis not present

## 2022-01-23 DIAGNOSIS — Z741 Need for assistance with personal care: Secondary | ICD-10-CM | POA: Diagnosis not present

## 2022-01-23 DIAGNOSIS — R262 Difficulty in walking, not elsewhere classified: Secondary | ICD-10-CM | POA: Diagnosis not present

## 2022-01-23 DIAGNOSIS — I639 Cerebral infarction, unspecified: Secondary | ICD-10-CM | POA: Diagnosis not present

## 2022-01-24 DIAGNOSIS — I639 Cerebral infarction, unspecified: Secondary | ICD-10-CM | POA: Diagnosis not present

## 2022-01-24 DIAGNOSIS — R262 Difficulty in walking, not elsewhere classified: Secondary | ICD-10-CM | POA: Diagnosis not present

## 2022-01-24 DIAGNOSIS — Z741 Need for assistance with personal care: Secondary | ICD-10-CM | POA: Diagnosis not present

## 2022-01-24 DIAGNOSIS — I69351 Hemiplegia and hemiparesis following cerebral infarction affecting right dominant side: Secondary | ICD-10-CM | POA: Diagnosis not present

## 2022-01-25 DIAGNOSIS — M24541 Contracture, right hand: Secondary | ICD-10-CM | POA: Diagnosis not present

## 2022-01-25 DIAGNOSIS — R262 Difficulty in walking, not elsewhere classified: Secondary | ICD-10-CM | POA: Diagnosis not present

## 2022-01-25 DIAGNOSIS — M24531 Contracture, right wrist: Secondary | ICD-10-CM | POA: Diagnosis not present

## 2022-01-25 DIAGNOSIS — I639 Cerebral infarction, unspecified: Secondary | ICD-10-CM | POA: Diagnosis not present

## 2022-01-25 DIAGNOSIS — I69351 Hemiplegia and hemiparesis following cerebral infarction affecting right dominant side: Secondary | ICD-10-CM | POA: Diagnosis not present

## 2022-01-25 DIAGNOSIS — R2689 Other abnormalities of gait and mobility: Secondary | ICD-10-CM | POA: Diagnosis not present

## 2022-01-25 DIAGNOSIS — Z741 Need for assistance with personal care: Secondary | ICD-10-CM | POA: Diagnosis not present

## 2022-01-29 DIAGNOSIS — R262 Difficulty in walking, not elsewhere classified: Secondary | ICD-10-CM | POA: Diagnosis not present

## 2022-01-29 DIAGNOSIS — I69351 Hemiplegia and hemiparesis following cerebral infarction affecting right dominant side: Secondary | ICD-10-CM | POA: Diagnosis not present

## 2022-01-29 DIAGNOSIS — M24531 Contracture, right wrist: Secondary | ICD-10-CM | POA: Diagnosis not present

## 2022-01-29 DIAGNOSIS — Z741 Need for assistance with personal care: Secondary | ICD-10-CM | POA: Diagnosis not present

## 2022-01-29 DIAGNOSIS — R2689 Other abnormalities of gait and mobility: Secondary | ICD-10-CM | POA: Diagnosis not present

## 2022-01-29 DIAGNOSIS — I639 Cerebral infarction, unspecified: Secondary | ICD-10-CM | POA: Diagnosis not present

## 2022-01-29 DIAGNOSIS — M24541 Contracture, right hand: Secondary | ICD-10-CM | POA: Diagnosis not present

## 2022-01-30 DIAGNOSIS — R262 Difficulty in walking, not elsewhere classified: Secondary | ICD-10-CM | POA: Diagnosis not present

## 2022-01-30 DIAGNOSIS — I639 Cerebral infarction, unspecified: Secondary | ICD-10-CM | POA: Diagnosis not present

## 2022-01-30 DIAGNOSIS — Z741 Need for assistance with personal care: Secondary | ICD-10-CM | POA: Diagnosis not present

## 2022-01-30 DIAGNOSIS — I69351 Hemiplegia and hemiparesis following cerebral infarction affecting right dominant side: Secondary | ICD-10-CM | POA: Diagnosis not present

## 2022-01-31 DIAGNOSIS — Z741 Need for assistance with personal care: Secondary | ICD-10-CM | POA: Diagnosis not present

## 2022-01-31 DIAGNOSIS — I69351 Hemiplegia and hemiparesis following cerebral infarction affecting right dominant side: Secondary | ICD-10-CM | POA: Diagnosis not present

## 2022-01-31 DIAGNOSIS — R262 Difficulty in walking, not elsewhere classified: Secondary | ICD-10-CM | POA: Diagnosis not present

## 2022-01-31 DIAGNOSIS — I639 Cerebral infarction, unspecified: Secondary | ICD-10-CM | POA: Diagnosis not present

## 2022-02-01 DIAGNOSIS — R2689 Other abnormalities of gait and mobility: Secondary | ICD-10-CM | POA: Diagnosis not present

## 2022-02-01 DIAGNOSIS — I639 Cerebral infarction, unspecified: Secondary | ICD-10-CM | POA: Diagnosis not present

## 2022-02-01 DIAGNOSIS — Z741 Need for assistance with personal care: Secondary | ICD-10-CM | POA: Diagnosis not present

## 2022-02-01 DIAGNOSIS — M24541 Contracture, right hand: Secondary | ICD-10-CM | POA: Diagnosis not present

## 2022-02-01 DIAGNOSIS — I69351 Hemiplegia and hemiparesis following cerebral infarction affecting right dominant side: Secondary | ICD-10-CM | POA: Diagnosis not present

## 2022-02-01 DIAGNOSIS — R262 Difficulty in walking, not elsewhere classified: Secondary | ICD-10-CM | POA: Diagnosis not present

## 2022-02-01 DIAGNOSIS — M24531 Contracture, right wrist: Secondary | ICD-10-CM | POA: Diagnosis not present

## 2022-02-05 DIAGNOSIS — I639 Cerebral infarction, unspecified: Secondary | ICD-10-CM | POA: Diagnosis not present

## 2022-02-05 DIAGNOSIS — R2689 Other abnormalities of gait and mobility: Secondary | ICD-10-CM | POA: Diagnosis not present

## 2022-02-05 DIAGNOSIS — R262 Difficulty in walking, not elsewhere classified: Secondary | ICD-10-CM | POA: Diagnosis not present

## 2022-02-05 DIAGNOSIS — M24541 Contracture, right hand: Secondary | ICD-10-CM | POA: Diagnosis not present

## 2022-02-05 DIAGNOSIS — I69351 Hemiplegia and hemiparesis following cerebral infarction affecting right dominant side: Secondary | ICD-10-CM | POA: Diagnosis not present

## 2022-02-05 DIAGNOSIS — Z741 Need for assistance with personal care: Secondary | ICD-10-CM | POA: Diagnosis not present

## 2022-02-05 DIAGNOSIS — M24531 Contracture, right wrist: Secondary | ICD-10-CM | POA: Diagnosis not present

## 2022-02-06 DIAGNOSIS — R262 Difficulty in walking, not elsewhere classified: Secondary | ICD-10-CM | POA: Diagnosis not present

## 2022-02-06 DIAGNOSIS — Z741 Need for assistance with personal care: Secondary | ICD-10-CM | POA: Diagnosis not present

## 2022-02-06 DIAGNOSIS — I69351 Hemiplegia and hemiparesis following cerebral infarction affecting right dominant side: Secondary | ICD-10-CM | POA: Diagnosis not present

## 2022-02-06 DIAGNOSIS — I639 Cerebral infarction, unspecified: Secondary | ICD-10-CM | POA: Diagnosis not present

## 2022-02-07 DIAGNOSIS — R262 Difficulty in walking, not elsewhere classified: Secondary | ICD-10-CM | POA: Diagnosis not present

## 2022-02-07 DIAGNOSIS — I69351 Hemiplegia and hemiparesis following cerebral infarction affecting right dominant side: Secondary | ICD-10-CM | POA: Diagnosis not present

## 2022-02-07 DIAGNOSIS — I639 Cerebral infarction, unspecified: Secondary | ICD-10-CM | POA: Diagnosis not present

## 2022-02-07 DIAGNOSIS — Z741 Need for assistance with personal care: Secondary | ICD-10-CM | POA: Diagnosis not present

## 2022-03-02 DIAGNOSIS — E039 Hypothyroidism, unspecified: Secondary | ICD-10-CM | POA: Diagnosis not present

## 2022-03-02 DIAGNOSIS — G40909 Epilepsy, unspecified, not intractable, without status epilepticus: Secondary | ICD-10-CM | POA: Diagnosis not present

## 2022-03-07 DIAGNOSIS — R32 Unspecified urinary incontinence: Secondary | ICD-10-CM | POA: Diagnosis not present

## 2022-03-07 DIAGNOSIS — E559 Vitamin D deficiency, unspecified: Secondary | ICD-10-CM | POA: Diagnosis not present

## 2022-03-07 DIAGNOSIS — U071 COVID-19: Secondary | ICD-10-CM | POA: Diagnosis not present

## 2022-03-07 DIAGNOSIS — R569 Unspecified convulsions: Secondary | ICD-10-CM | POA: Diagnosis not present

## 2022-03-07 DIAGNOSIS — R471 Dysarthria and anarthria: Secondary | ICD-10-CM | POA: Diagnosis not present

## 2022-03-07 DIAGNOSIS — Z7409 Other reduced mobility: Secondary | ICD-10-CM | POA: Diagnosis not present

## 2022-03-07 DIAGNOSIS — E039 Hypothyroidism, unspecified: Secondary | ICD-10-CM | POA: Diagnosis not present

## 2022-03-07 DIAGNOSIS — I679 Cerebrovascular disease, unspecified: Secondary | ICD-10-CM | POA: Diagnosis not present

## 2022-03-08 DIAGNOSIS — I639 Cerebral infarction, unspecified: Secondary | ICD-10-CM | POA: Diagnosis not present

## 2022-03-08 DIAGNOSIS — R2689 Other abnormalities of gait and mobility: Secondary | ICD-10-CM | POA: Diagnosis not present

## 2022-03-12 DIAGNOSIS — M24531 Contracture, right wrist: Secondary | ICD-10-CM | POA: Diagnosis not present

## 2022-03-12 DIAGNOSIS — M24541 Contracture, right hand: Secondary | ICD-10-CM | POA: Diagnosis not present

## 2022-03-12 DIAGNOSIS — R2689 Other abnormalities of gait and mobility: Secondary | ICD-10-CM | POA: Diagnosis not present

## 2022-03-12 DIAGNOSIS — I639 Cerebral infarction, unspecified: Secondary | ICD-10-CM | POA: Diagnosis not present

## 2022-03-12 DIAGNOSIS — I69351 Hemiplegia and hemiparesis following cerebral infarction affecting right dominant side: Secondary | ICD-10-CM | POA: Diagnosis not present

## 2022-03-19 DIAGNOSIS — R2689 Other abnormalities of gait and mobility: Secondary | ICD-10-CM | POA: Diagnosis not present

## 2022-03-19 DIAGNOSIS — M24531 Contracture, right wrist: Secondary | ICD-10-CM | POA: Diagnosis not present

## 2022-03-19 DIAGNOSIS — I69351 Hemiplegia and hemiparesis following cerebral infarction affecting right dominant side: Secondary | ICD-10-CM | POA: Diagnosis not present

## 2022-03-19 DIAGNOSIS — I639 Cerebral infarction, unspecified: Secondary | ICD-10-CM | POA: Diagnosis not present

## 2022-03-19 DIAGNOSIS — M24541 Contracture, right hand: Secondary | ICD-10-CM | POA: Diagnosis not present

## 2022-03-26 DIAGNOSIS — R2689 Other abnormalities of gait and mobility: Secondary | ICD-10-CM | POA: Diagnosis not present

## 2022-03-26 DIAGNOSIS — I69351 Hemiplegia and hemiparesis following cerebral infarction affecting right dominant side: Secondary | ICD-10-CM | POA: Diagnosis not present

## 2022-03-26 DIAGNOSIS — M24541 Contracture, right hand: Secondary | ICD-10-CM | POA: Diagnosis not present

## 2022-03-26 DIAGNOSIS — I639 Cerebral infarction, unspecified: Secondary | ICD-10-CM | POA: Diagnosis not present

## 2022-03-26 DIAGNOSIS — M24531 Contracture, right wrist: Secondary | ICD-10-CM | POA: Diagnosis not present

## 2022-03-27 DIAGNOSIS — I639 Cerebral infarction, unspecified: Secondary | ICD-10-CM | POA: Diagnosis not present

## 2022-03-27 DIAGNOSIS — R2689 Other abnormalities of gait and mobility: Secondary | ICD-10-CM | POA: Diagnosis not present

## 2022-03-28 DIAGNOSIS — M24531 Contracture, right wrist: Secondary | ICD-10-CM | POA: Diagnosis not present

## 2022-03-28 DIAGNOSIS — R2689 Other abnormalities of gait and mobility: Secondary | ICD-10-CM | POA: Diagnosis not present

## 2022-03-28 DIAGNOSIS — M24541 Contracture, right hand: Secondary | ICD-10-CM | POA: Diagnosis not present

## 2022-03-28 DIAGNOSIS — R4182 Altered mental status, unspecified: Secondary | ICD-10-CM | POA: Diagnosis not present

## 2022-03-28 DIAGNOSIS — I639 Cerebral infarction, unspecified: Secondary | ICD-10-CM | POA: Diagnosis not present

## 2022-03-28 DIAGNOSIS — Z7409 Other reduced mobility: Secondary | ICD-10-CM | POA: Diagnosis not present

## 2022-03-28 DIAGNOSIS — I69351 Hemiplegia and hemiparesis following cerebral infarction affecting right dominant side: Secondary | ICD-10-CM | POA: Diagnosis not present

## 2022-03-29 DIAGNOSIS — R32 Unspecified urinary incontinence: Secondary | ICD-10-CM | POA: Diagnosis not present

## 2022-03-29 DIAGNOSIS — R2689 Other abnormalities of gait and mobility: Secondary | ICD-10-CM | POA: Diagnosis not present

## 2022-03-29 DIAGNOSIS — Z741 Need for assistance with personal care: Secondary | ICD-10-CM | POA: Diagnosis not present

## 2022-03-29 DIAGNOSIS — I639 Cerebral infarction, unspecified: Secondary | ICD-10-CM | POA: Diagnosis not present

## 2022-03-29 DIAGNOSIS — Z7409 Other reduced mobility: Secondary | ICD-10-CM | POA: Diagnosis not present

## 2022-03-29 DIAGNOSIS — G40909 Epilepsy, unspecified, not intractable, without status epilepticus: Secondary | ICD-10-CM | POA: Diagnosis not present

## 2022-03-30 DIAGNOSIS — N390 Urinary tract infection, site not specified: Secondary | ICD-10-CM | POA: Diagnosis not present

## 2022-04-02 DIAGNOSIS — R2689 Other abnormalities of gait and mobility: Secondary | ICD-10-CM | POA: Diagnosis not present

## 2022-04-02 DIAGNOSIS — I639 Cerebral infarction, unspecified: Secondary | ICD-10-CM | POA: Diagnosis not present

## 2022-04-02 DIAGNOSIS — M24541 Contracture, right hand: Secondary | ICD-10-CM | POA: Diagnosis not present

## 2022-04-02 DIAGNOSIS — M24531 Contracture, right wrist: Secondary | ICD-10-CM | POA: Diagnosis not present

## 2022-04-02 DIAGNOSIS — I69351 Hemiplegia and hemiparesis following cerebral infarction affecting right dominant side: Secondary | ICD-10-CM | POA: Diagnosis not present

## 2022-04-03 DIAGNOSIS — M1711 Unilateral primary osteoarthritis, right knee: Secondary | ICD-10-CM | POA: Diagnosis not present

## 2022-04-03 DIAGNOSIS — M1712 Unilateral primary osteoarthritis, left knee: Secondary | ICD-10-CM | POA: Diagnosis not present

## 2022-04-06 DIAGNOSIS — R2689 Other abnormalities of gait and mobility: Secondary | ICD-10-CM | POA: Diagnosis not present

## 2022-04-06 DIAGNOSIS — I639 Cerebral infarction, unspecified: Secondary | ICD-10-CM | POA: Diagnosis not present

## 2023-10-03 ENCOUNTER — Encounter: Payer: Self-pay | Admitting: Neurology

## 2023-10-03 ENCOUNTER — Ambulatory Visit (INDEPENDENT_AMBULATORY_CARE_PROVIDER_SITE_OTHER): Payer: Medicare Other | Admitting: Neurology

## 2023-10-03 VITALS — BP 150/67 | HR 61 | Resp 16

## 2023-10-03 DIAGNOSIS — G8191 Hemiplegia, unspecified affecting right dominant side: Secondary | ICD-10-CM

## 2023-10-03 DIAGNOSIS — G40009 Localization-related (focal) (partial) idiopathic epilepsy and epileptic syndromes with seizures of localized onset, not intractable, without status epilepticus: Secondary | ICD-10-CM

## 2023-10-03 DIAGNOSIS — I69320 Aphasia following cerebral infarction: Secondary | ICD-10-CM

## 2023-10-03 DIAGNOSIS — F0390 Unspecified dementia without behavioral disturbance: Secondary | ICD-10-CM

## 2023-10-03 DIAGNOSIS — Z8673 Personal history of transient ischemic attack (TIA), and cerebral infarction without residual deficits: Secondary | ICD-10-CM | POA: Diagnosis not present

## 2023-10-03 MED ORDER — DONEPEZIL HCL 5 MG PO TABS: 5.0000 mg | ORAL_TABLET | Freq: Every day | ORAL | 6 refills | Status: AC

## 2023-10-03 NOTE — Progress Notes (Signed)
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  Daughter tells me that patient has not seen a neurologist for the past 5 years.  Her stroke in 2008 left her hemiplegic with global aphasia.  She also had seizures, her last seizure was reported in 2010.  She has been doing well but start developing memory problem and diagnosed with dementia patient has been on Namenda since September 2024.  Daughter tells me there is increased confusion, she is currently at [facility] and rehab but she has been packing and trying to leave.  She also has a history of recurrent UTIs which makes her confused at times.  They tell me she can help dress herself, help wash herself, she is able to feed herself.  She can transfer from the chair to the bed, she also participate in physical therapy.  There is a family history of dementia in both parents.   OTHER MEDICAL CONDITIONS: Left MCA stroke with residual right hemiplegia and aphasia, Hypertension, Hyperlipidemia, Hypothyroidism    REVIEW OF SYSTEMS: Full 14 system review of systems performed and negative with exception of: Unable to obtain due to aphasia   ALLERGIES: Allergies  Allergen Reactions   Other Rash   Tape     rash    HOME MEDICATIONS: Outpatient Medications Prior to Visit   Medication Sig Dispense Refill   atorvastatin (LIPITOR) 40 MG tablet Take 40 mg by mouth daily.     baclofen (LIORESAL) 10 MG tablet Take 10 mg by mouth 3 (three) times daily.     calcium carbonate (OS-CAL) 600 MG TABS tablet Take by mouth daily with breakfast.     cholecalciferol (VITAMIN D3) 10 MCG/ML LIQD oral liquid Take 400 Units by mouth daily.     clopidogrel (PLAVIX) 75 MG tablet Take 75 mg by mouth daily.     DULoxetine (CYMBALTA) 60 MG capsule Take 60 mg by mouth daily.     levETIRAcetam (KEPPRA) 500 MG tablet Take 500 mg by mouth 2 (two) times daily.     Levothyroxine Sodium (SYNTHROID PO) Take by mouth.     Melatonin 5 MG CAPS Take 1 capsule by mouth at bedtime.     memantine (NAMENDA) 10 MG tablet Take 10 mg by mouth 2 (two) times daily.     mesalamine (LIALDA) 1.2 g EC tablet Take 1.2 g by mouth daily with breakfast.     DULoxetine (CYMBALTA) 20 MG capsule Take 20 mg by mouth daily.     levothyroxine (SYNTHROID) 125 MCG tablet Take 125 mcg by mouth daily before breakfast.     mesalamine (LIALDA) 1.2 g EC tablet Take by mouth daily with breakfast.     aspirin 81 MG tablet Take 81 mg by mouth daily.     aspirin EC 81 MG tablet Take 81 mg by mouth daily.     calcium carbonate (CALCIUM 600) 600 MG TABS tablet Take  600 mg by mouth 2 (two) times daily with a meal.     cefixime (SUPRAX) 400 MG CAPS capsule Take 400 mg by mouth daily. Started x 3 days ago     citalopram (CELEXA) 40 MG tablet Take 40 mg by mouth daily.     HYDROcodone-acetaminophen (NORCO/VICODIN) 5-325 MG tablet Take 1 tablet by mouth every 6 (six) hours as needed for severe pain. 6 tablet 0   simvastatin (ZOCOR) 20 MG tablet Take 20 mg by mouth daily.     No facility-administered medications prior to visit.    PAST MEDICAL HISTORY: Past Medical History:  Diagnosis Date   Hypertension    Stroke [facility])    Thyroid disease     PAST SURGICAL HISTORY: Past Surgical History:  Procedure Laterality Date   CESAREAN  SECTION     THYROID SURGERY      FAMILY HISTORY: History reviewed. No pertinent family history.  SOCIAL HISTORY: Social History   Socioeconomic History   Marital status: Married    Spouse name: Not on file   Number of children: Not on file   Years of education: Not on file   Highest education level: Not on file  Occupational History   Not on file  Tobacco Use   Smoking status: Never   Smokeless tobacco: Never  Substance and Sexual Activity   Alcohol use: Not Currently   Drug use: Not Currently   Sexual activity: Not on file  Other Topics Concern   Not on file  Social History Narrative   ** Merged History Encounter **       Social Drivers of Health   Financial Resource Strain: Not on file  Food Insecurity: Not on file  Transportation Needs: Not on file  Physical Activity: Not on file  Stress: Not on file  Social Connections: Unknown (01/21/2022)   Received from [facility]   Social Network    Social Network: Not on file  Intimate Partner Violence: Unknown (12/13/2021)   Received from [facility], [facility]   HITS    Physically Hurt: Not on file    Insult or Talk Down To: Not on file    Threaten Physical Harm: Not on file    Scream or Curse: Not on file    PHYSICAL EXAM  GENERAL EXAM/CONSTITUTIONAL: Vitals:  Vitals:   10/03/23 1427  BP: (!) 150/67  Pulse: 61  Resp: 16  SpO2: 94%   There is no height or weight on file to calculate BMI. Wt Readings from Last 3 Encounters:  11/15/19 185 lb (83.9 kg)  10/17/19 203 lb 11.2 oz (92.4 kg)  04/11/19 198 lb 13.7 oz (90.2 kg)   Patient is in no distress; well developed, nourished and groomed; neck is supple  MUSCULOSKELETAL: Gait, strength, tone, movements noted in Neurologic exam below  NEUROLOGIC: MENTAL STATUS:     10/03/2023    2:28 PM  MMSE - Mini Mental State Exam  Not completed: Unable to complete   awake, alert, globally aphasic. Keep repeating, "I do, I do, I do" Able to  mimic examiner  Able to track examiner  LUE and LLE at least antigravity Using a wheelchair      DIAGNOSTIC DATA (LABS, IMAGING, TESTING) - I reviewed patient records, labs, notes, testing and imaging myself where available.  Lab Results  Component Value Date   WBC 7.9 11/15/2019   HGB 13.9 11/15/2019   HCT 41.0 11/15/2019   MCV 92.7 11/15/2019   PLT 265  11/15/2019      Component Value Date/Time   NA 139 11/15/2019 2027   K 3.8 11/15/2019 2027   CL 102 11/15/2019 2027   CO2 25 11/15/2019 1927   GLUCOSE 89 11/15/2019 2027   BUN 22 11/15/2019 2027   CREATININE 0.90 11/15/2019 2027   CALCIUM 9.6 11/15/2019 1927   PROT 7.5 11/15/2019 1927   ALBUMIN 4.0 11/15/2019 1927   AST 20 11/15/2019 1927   ALT 19 11/15/2019 1927   ALKPHOS 57 11/15/2019 1927   BILITOT 0.4 11/15/2019 1927   GFRNONAA 57 (L) 11/15/2019 1927   GFRAA >60 11/15/2019 1927   No results found for: "CHOL", "HDL", "LDLCALC", "LDLDIRECT", "TRIG", "CHOLHDL" No results found for: "HGBA1C" No results found for: "VITAMINB12" No results found for: "TSH"  CT Head 11/15/2019 1. No acute intracranial abnormality or acute traumatic injury identified. 2. Chronic left MCA infarct.    ASSESSMENT AND PLAN  73 y.o. 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 Most of her deficits are chronic and stemmed from her L MCA stroke in 2008. Will look for Alzheimer dementia biomarkers for additional clarification of the dementia diagnosis. Plan remains the same, continue current medications, will add Aricept 5 mg nightly and consider switching Keppra to Depakote.    1. Chronic ischemic left MCA stroke   2. Right hemiplegia (HCC)   3. Aphasia as late effect of cerebrovascular accident   4. Localization-related idiopathic epilepsy and epileptic syndromes with seizures of localized  onset, not intractable, without status epilepticus (HCC)   5. Dementia, unspecified dementia severity, unspecified dementia type, unspecified whether behavioral, psychotic, or mood disturbance or anxiety (HCC)      Patient Instructions  Continue current medications Add Aricept 5 mg nightly, discussed side effect of the medication including dizziness, vivid dreams and diarrhea.  If able to tolerate, will increase to 10 mg nightly Consider switching Keppra to Depakote Follow-up as needed  Orders Placed This Encounter  Procedures   ATN PROFILE   Vitamin B12   TSH    Meds ordered this encounter  Medications   donepezil (ARICEPT) 5 MG tablet    Sig: Take 1 tablet (5 mg total) by mouth at bedtime.    Dispense:  30 tablet    Refill:  6    Return if symptoms worsen or fail to improve.    Windell Norfolk, MD 10/03/2023, 4:45 PM  [facility] [address] [place], [place] [address] [phone]

## 2023-10-03 NOTE — Patient Instructions (Signed)
Continue current medications Add Aricept 5 mg nightly, discussed side effect of the medication including dizziness, vivid dreams and diarrhea.  If able to tolerate, will increase to 10 mg nightly Consider switching Keppra to Depakote Follow-up as needed

## 2023-10-06 LAB — TSH: TSH: 85.8 u[IU]/mL — ABNORMAL HIGH (ref 0.450–4.500)

## 2023-10-06 LAB — ATN PROFILE
A -- Beta-amyloid 42/40 Ratio: 0.099 — ABNORMAL LOW (ref >0.102)
Beta-amyloid 40: 243.33 pg/mL
Beta-amyloid 42: 24.07 pg/mL
N -- NfL, Plasma: 3.45 pg/mL (ref 0.00–7.64)
T -- p-tau181: 0.92 pg/mL (ref 0.00–0.97)

## 2023-10-06 LAB — VITAMIN B12: Vitamin B-12: 701 pg/mL (ref 232–1245)

## 2023-10-08 ENCOUNTER — Telehealth: Payer: Self-pay | Admitting: Anesthesiology

## 2023-10-08 NOTE — Progress Notes (Signed)
Spoke with daughter, informed her that recent labs showed an elevated TSH and low amyloid B42/40 ratio. This results might be consistent with Alzheimer disease pathology. She is already on Namenda and Aricept. Will fax these results to [facility] health and rehab.   Dr. Teresa Coombs

## 2023-10-08 NOTE — Telephone Encounter (Signed)
Per Dr Karie Georges request a copy of lab results TSH, ATN and B12 were faxed to [facility] at [phone]. Confirmation received.

## 2023-10-29 ENCOUNTER — Other Ambulatory Visit: Payer: Self-pay | Admitting: Urology

## 2023-10-29 ENCOUNTER — Other Ambulatory Visit (HOSPITAL_COMMUNITY): Payer: Self-pay | Admitting: Urology

## 2023-10-29 DIAGNOSIS — N200 Calculus of kidney: Secondary | ICD-10-CM

## 2023-12-02 ENCOUNTER — Encounter (HOSPITAL_COMMUNITY): Payer: Self-pay | Admitting: Urology

## 2023-12-02 NOTE — Progress Notes (Signed)
 Preop instructions for:     Suzanne Barnett Date of Birth:   08/17/1951                    Date of Procedure:   12-09-23 Procedure:    Left Percutaneous Nephrolithotomy Surgeon:  Dr. Modena Slater Facility contact:     Phone:  [phone]    Fax#           Health Care POA: [name]                           Transportation contact phone#:  [facility] [phone]  Please send day of procedure:  Current med list  Medications taken the day of procedure (return attached form to hospital) confirm time of nothing by mouth status (return attached form to hospital) Patient Demographic info( to include DNR status, problem list, allergies) Bring Insurance card and picture ID    Time to arrive at [facility]: 7:30 AM   Report to: Admitting (On your left hand side)    Do not eat solid food or drink liquids past midnight the night before your procedure.(To include any tube feedings-must be discontinued)   Take these morning medications only with sips of water.(or give through gastrostomy or feeding tube).  Duloxetine, Levothyroxine, Keppra, Namenda, Depakote, Lialda.  If needed Tramadol and Tylenol  Hold Plavix and Aspirin per directions from surgeon.  Hold after 12-03-23  Stop all vitamins and herbal supplements 7 days before surgery.   Note: No Insulin or Diabetic meds should be given or taken the morning of the procedure!  Oral Hygiene is also important to reduce your risk of infection.                                    Remember - BRUSH YOUR TEETH THE MORNING OF SURGERY WITH YOUR REGULAR TOOTHPASTE   DENTURES WILL BE REMOVED PRIOR TO SURGERY PLEASE DO NOT APPLY "Poly grip" OR ADHESIVES!!!  Leave all jewelry and other valuables at place where living( no metal or rings to be worn) No contact lens Women-no make-up, no lotions,perfumes,powders Men-no colognes,lotions   Any questions day of procedure,call  SHORT STAY-[phone]     Sent from :[facility]                   Phone:[phone]                   Fax:[phone]   Sent by :    Derek Mound,  RN

## 2023-12-02 NOTE — Progress Notes (Signed)
 COVID Vaccine Completed:  Date of COVID positive in last 90 days:  PCP - Merrilyn Puma, DO Cardiologist -  Neurologist - Windell Norfolk, MD  Chest x-ray -  EKG - Day of surgery Stress Test -  ECHO -  Cardiac Cath -  Pacemaker/ICD device last checked: Spinal Cord Stimulator:  Bowel Prep - N/A  Sleep Study -  CPAP -   Fasting Blood Sugar -  Checks Blood Sugar _____ times a day  Last dose of GLP1 agonist-  N/A GLP1 instructions:  Hold 7 days before surgery    Last dose of SGLT-2 inhibitors-  N/A SGLT-2 instructions:  Hold 3 days before surgery   Blood Thinner Instructions:  Plavix  Last dose:   per nursing home  12-03-23 Aspirin Instructions:   ASA 81 Last Dose: Per nursing home 12-03-23  Activity level:  Patient resides in a nursing home.  Has hemiplegia.  Able to assist with dressing and bathing.  Able to feed herself.    Anesthesia review:  Stroke with hemiplegia and aphasia, HTN.  Epilepsy (last seizure 2010). HTN, dementia  Patient denies shortness of breath, fever, cough and chest pain at PAT appointment  Patient verbalized understanding of instructions that were given to them at the PAT appointment. Patient was also instructed that they will need to review over the PAT instructions again at home before surgery.

## 2023-12-04 NOTE — Patient Instructions (Addendum)
 Preop instructions for:     Suzanne Barnett Date of Birth:   12/25/1950                    Date of Procedure:   12-09-23 Procedure:    Left Percutaneous Nephrolithotomy Surgeon:  Dr. Modena Slater Facility contact:     Phone:  [phone]    Fax#  [phone]         Health Care POA: Samanvitha Germany (spouse) [phone]                           Transportation contact phone#:  [facility] [phone]  Please send day of procedure:  Current med list  Medications taken the day of procedure (return attached form to hospital) confirm time of nothing by mouth status (return attached form to hospital) Patient Demographic info( to include DNR status, problem list, allergies) Bring Insurance card and picture ID    Time to arrive at [facility]: 7:30 AM   Report to: Admitting (On your left hand side)    Do not eat solid food or drink liquids past midnight the night before your procedure.(To include any tube feedings-must be discontinued)   Take these morning medications only with sips of water.(or give through gastrostomy or feeding tube).  Duloxetine, Levothyroxine, Keppra, Namenda, Depakote, Lialda.  If needed Tramadol and Tylenol  Hold Plavix and Aspirin per directions from surgeon.  Hold after 12-03-23  Stop all vitamins and herbal supplements 7 days before surgery.   Note: No Insulin or Diabetic meds should be given or taken the morning of the procedure!  Oral Hygiene is also important to reduce your risk of infection.                                    Remember - BRUSH YOUR TEETH THE MORNING OF SURGERY WITH YOUR REGULAR TOOTHPASTE   DENTURES WILL BE REMOVED PRIOR TO SURGERY PLEASE DO NOT APPLY "Poly grip" OR ADHESIVES!!!  Leave all jewelry and other valuables at place where living( no metal or rings to be worn) No contact lens Women-no make-up, no lotions,perfumes,powders Men-no colognes,lotions   Any questions day of procedure,call  SHORT  [phone]     Sent from :[facility]                   Phone:[phone]                   [phone]   Sent by :    Derek Mound,  RN

## 2023-12-06 ENCOUNTER — Other Ambulatory Visit: Payer: Self-pay | Admitting: Radiology

## 2023-12-06 DIAGNOSIS — N200 Calculus of kidney: Secondary | ICD-10-CM

## 2023-12-06 NOTE — H&P (Signed)
 Chief Complaint: Left percutaneous nephrostomy with OR to follow with nephrolithotomy for left renal stone  Referring Provider(s): Dr. Modena Slater, urology  Supervising Physician: Roanna Banning  Patient Status: [facility] - Out-pt  History of Present Illness: Suzanne Barnett is a 73 y.o. female with history of HTN, HLD, CVA, anemia, dementia presenting for left percutaneous nephrostomy placement with OR to follow for nephrolithotomy for left renal stone.   Patient is Full Code  Past Medical History:  Diagnosis Date   Anemia    Aphasia    Dementia (HCC)    Depression    Epilepsy (HCC)    Hemiplegia (HCC)    Hyperlipidemia    Hypertension    Hypothyroidism    Stroke [facility])    Thyroid disease    Urinary incontinence     Past Surgical History:  Procedure Laterality Date   CESAREAN SECTION     THYROID SURGERY      Allergies: Other and Tape  Medications: Prior to Admission medications   Medication Sig Start Date End Date Taking? Authorizing Provider  acetaminophen (TYLENOL) 500 MG tablet Take 1,000 mg by mouth 2 (two) times daily.    [provider]  ascorbic acid (VITAMIN C) 500 MG tablet Take 500 mg by mouth 2 (two) times daily.    [provider]  aspirin EC 81 MG tablet Take 81 mg by mouth daily. Swallow whole.    [provider]  atorvastatin (LIPITOR) 40 MG tablet Take 40 mg by mouth at bedtime.    [provider]  baclofen (LIORESAL) 10 MG tablet Take 10 mg by mouth at bedtime.    [provider]  Cholecalciferol 75 MCG (3000 UT) TABS Take 6,000 Units by mouth daily.    [provider]  clopidogrel (PLAVIX) 75 MG tablet Take 75 mg by mouth daily.    [provider]  diclofenac Sodium (VOLTAREN) 1 % GEL Apply 2 g topically 3 (three) times daily. Additional of needed every 6 hours for pain    [provider]  divalproex (DEPAKOTE) 125 MG DR tablet Take 125 mg by mouth 3 (three) times daily. 12/04/23    [provider]  donepezil (ARICEPT) 5 MG tablet Take 1 tablet (5 mg total) by mouth at bedtime. 10/03/23   Windell Norfolk, MD  DULoxetine (CYMBALTA) 60 MG capsule Take 60 mg by mouth daily.    [provider]  levETIRAcetam (KEPPRA) 500 MG tablet Take 500 mg by mouth 2 (two) times daily.    [provider]  levothyroxine (SYNTHROID) 150 MCG tablet Take 150 mcg by mouth daily before breakfast.    [provider]  lidocaine 4 % Place 1 patch onto the skin daily as needed (Shoulder pain). ever    [provider]  LORazepam (ATIVAN) 0.5 MG tablet Take 0.5 mg by mouth every 12 (twelve) hours as needed for anxiety.    [provider]  magnesium hydroxide (MILK OF MAGNESIA) 400 MG/5ML suspension Take 45 mLs by mouth daily as needed for mild constipation or moderate constipation.    [provider]  Melatonin 5 MG CAPS Take 10 mg by mouth at bedtime.    [provider]  memantine (NAMENDA) 10 MG tablet Take 10 mg by mouth 2 (two) times daily.    [provider]  mesalamine (LIALDA) 1.2 g EC tablet Take 1.2 g by mouth 3 (three) times daily.    [provider]  PRESCRIPTION MEDICATION at bedtime. Snack according  to diet    [provider]  traMADol (ULTRAM) 50 MG tablet Take 50 mg by mouth every 8 (eight) hours as needed for moderate pain (pain score 4-6).    [provider]     No family history on file.  Social History   Socioeconomic History   Marital status: Married    Spouse name: Not on file   Number of children: Not on file   Years of education: Not on file   Highest education level: Not on file  Occupational History   Not on file  Tobacco Use   Smoking status: Never   Smokeless tobacco: Never  Substance and Sexual Activity   Alcohol use: Not Currently   Drug use: Not Currently   Sexual activity: Not on file  Other Topics Concern   Not on file  Social History Narrative   **  Merged History Encounter **       Social Drivers of Health   Financial Resource Strain: Not on file  Food Insecurity: Not on file  Transportation Needs: Not on file  Physical Activity: Not on file  Stress: Not on file  Social Connections: Unknown (01/21/2022)   Received from [facility]   Social Network    Social Network: Not on file       Review of Systems no reports per family of fever, HA,CP, dyspnea, cough, abd pain,back pain,N/V or bleeding  Vital Signs: Temp 97.6, BP 172/69  HR 53  R 16 O2 SATS 100% RA    Advance Care Plan: No documents on file.  Physical Exam; awake, globally aphasic; only response to questions is "I do , I do, I do"; chest - CTA bilat; heart- sl bradycardic but reg rhythm; abd-soft,+BS,NT; no LE edema; residual rt hemiplegia  Imaging: No results found.  Labs:  CBC: No results for input(s): "WBC", "HGB", "HCT", "PLT" in the last 8760 hours.  COAGS: No results for input(s): "INR", "APTT" in the last 8760 hours.  BMP: No results for input(s): "NA", "K", "CL", "CO2", "GLUCOSE", "BUN", "CALCIUM", "CREATININE", "GFRNONAA", "GFRAA" in the last 8760 hours.  Invalid input(s): "CMP"  LIVER FUNCTION TESTS: No results for input(s): "BILITOT", "AST", "ALT", "ALKPHOS", "PROT", "ALBUMIN" in the last 8760 hours.  TUMOR MARKERS: No results for input(s): "AFPTM", "CEA", "CA199", "CHROMGRNA" in the last 8760 hours.  Assessment and Plan:  Suzanne Barnett is a 72 y.o. female with history of HTN, dementia, HLD, CVA 2008 with residual rt hemiplegia/aphasia , anemia, presenting for left percutaneous nephrostomy/nephroureteral catheter placement with OR to follow for nephrolithotomy for left renal stone.  Risks and benefits of left percutaneous nephrostomy placement was discussed with the patient's spouse/daughter including, but not limited to, infection, bleeding, significant bleeding causing loss or decrease in renal function or damage to  adjacent structures.   All of the patient's questions were answered, patient is agreeable to proceed.  Consent signed and in chart.  LABS PENDING   Thank you for allowing our service to participate in Jermisha Hoffart 's care.  Electronically Signed: Katheren Puller, PA-C   12/06/2023, 1:23 PM      I spent a total of  30 Minutes   in face to face in clinical consultation, greater than 50% of which was counseling/coordinating care for left percutaneous nephrostomy.

## 2023-12-08 NOTE — Anesthesia Preprocedure Evaluation (Signed)
 Anesthesia Evaluation  Patient identified by MRN, date of birth, ID band Patient awake    Reviewed: Allergy & Precautions, NPO status , Patient's Chart, lab work & pertinent test results  History of Anesthesia Complications Negative for: history of anesthetic complications  Airway Mallampati: II  TM Distance: >3 FB Neck ROM: Full    Dental no notable dental hx. (+) Teeth Intact, Dental Advisory Given   Pulmonary    Pulmonary exam normal breath sounds clear to auscultation       Cardiovascular hypertension, Normal cardiovascular exam Rhythm:Regular Rate:Normal     Neuro/Psych Seizures -,  PSYCHIATRIC DISORDERS  Depression    CVA (R hemiplegia and Aphasia), Residual Symptoms    GI/Hepatic Neg liver ROS,,,  Endo/Other  Hypothyroidism    Renal/GU      Musculoskeletal   Abdominal   Peds  Hematology . Pt on plavix   Anesthesia Other Findings   Reproductive/Obstetrics                             Anesthesia Physical Anesthesia Plan  ASA: 3  Anesthesia Plan: General   Post-op Pain Management: Ofirmev IV (intra-op)*   Induction: Intravenous  PONV Risk Score and Plan: Treatment may vary due to age or medical condition, Ondansetron, Dexamethasone and Midazolam  Airway Management Planned:   Additional Equipment: ClearSight  Intra-op Plan:   Post-operative Plan: Extubation in OR  Informed Consent: I have reviewed the patients History and Physical, chart, labs and discussed the procedure including the risks, benefits and alternatives for the proposed anesthesia with the patient or authorized representative who has indicated his/her understanding and acceptance.     Dental advisory given  Plan Discussed with: CRNA and Surgeon  Anesthesia Plan Comments: (GA w Clear site)       Anesthesia Quick Evaluation

## 2023-12-09 ENCOUNTER — Observation Stay (HOSPITAL_COMMUNITY)
Admission: RE | Admit: 2023-12-09 | Discharge: 2023-12-10 | Disposition: A | Discharge status: Skilled Nursing Facility | Payer: PRIVATE HEALTH INSURANCE | Attending: Urology | Admitting: Urology

## 2023-12-09 ENCOUNTER — Other Ambulatory Visit: Payer: Self-pay

## 2023-12-09 ENCOUNTER — Ambulatory Visit (HOSPITAL_BASED_OUTPATIENT_CLINIC_OR_DEPARTMENT_OTHER): Payer: Self-pay | Admitting: Physician Assistant

## 2023-12-09 ENCOUNTER — Ambulatory Visit (HOSPITAL_COMMUNITY)
Admission: RE | Admit: 2023-12-09 | Discharge: 2023-12-09 | Disposition: A | Discharge status: Home / Self Care | Payer: Medicare Other | Source: Ambulatory Visit | Attending: Urology

## 2023-12-09 ENCOUNTER — Encounter (HOSPITAL_COMMUNITY)
Admission: RE | Disposition: A | Discharge status: Home / Self Care | Payer: Self-pay | Source: Home / Self Care | Attending: Urology

## 2023-12-09 ENCOUNTER — Encounter (HOSPITAL_COMMUNITY): Payer: Self-pay | Admitting: Urology

## 2023-12-09 ENCOUNTER — Ambulatory Visit (HOSPITAL_COMMUNITY): Payer: Self-pay | Admitting: Physician Assistant

## 2023-12-09 ENCOUNTER — Ambulatory Visit (HOSPITAL_COMMUNITY)
Admission: RE | Admit: 2023-12-09 | Discharge: 2023-12-09 | Disposition: A | Discharge status: Home / Self Care | Payer: Medicare Other | Source: Ambulatory Visit | Attending: Urology | Admitting: Urology

## 2023-12-09 ENCOUNTER — Ambulatory Visit (HOSPITAL_COMMUNITY)

## 2023-12-09 DIAGNOSIS — I10 Essential (primary) hypertension: Secondary | ICD-10-CM

## 2023-12-09 DIAGNOSIS — Z7982 Long term (current) use of aspirin: Secondary | ICD-10-CM | POA: Insufficient documentation

## 2023-12-09 DIAGNOSIS — Z7902 Long term (current) use of antithrombotics/antiplatelets: Secondary | ICD-10-CM | POA: Insufficient documentation

## 2023-12-09 DIAGNOSIS — N200 Calculus of kidney: Secondary | ICD-10-CM

## 2023-12-09 DIAGNOSIS — E039 Hypothyroidism, unspecified: Secondary | ICD-10-CM | POA: Diagnosis not present

## 2023-12-09 DIAGNOSIS — F0390 Unspecified dementia without behavioral disturbance: Secondary | ICD-10-CM | POA: Insufficient documentation

## 2023-12-09 DIAGNOSIS — Z79899 Other long term (current) drug therapy: Secondary | ICD-10-CM | POA: Diagnosis not present

## 2023-12-09 DIAGNOSIS — Z8673 Personal history of transient ischemic attack (TIA), and cerebral infarction without residual deficits: Secondary | ICD-10-CM | POA: Insufficient documentation

## 2023-12-09 HISTORY — DX: Hypothyroidism, unspecified: E03.9

## 2023-12-09 HISTORY — DX: Aphasia: R47.01

## 2023-12-09 HISTORY — DX: Epilepsy, unspecified, not intractable, without status epilepticus: G40.909

## 2023-12-09 HISTORY — DX: Hyperlipidemia, unspecified: E78.5

## 2023-12-09 HISTORY — PX: NEPHROLITHOTOMY: SHX5134

## 2023-12-09 HISTORY — DX: Unspecified urinary incontinence: R32

## 2023-12-09 HISTORY — DX: Unspecified dementia, unspecified severity, without behavioral disturbance, psychotic disturbance, mood disturbance, and anxiety: F03.90

## 2023-12-09 HISTORY — PX: IR URETERAL STENT LEFT NEW ACCESS W/O SEP NEPHROSTOMY CATH: IMG6075

## 2023-12-09 HISTORY — DX: Anemia, unspecified: D64.9

## 2023-12-09 HISTORY — DX: Depression, unspecified: F32.A

## 2023-12-09 HISTORY — DX: Hemiplegia, unspecified affecting unspecified side: G81.90

## 2023-12-09 LAB — HEMOGLOBIN AND HEMATOCRIT, BLOOD
HCT: 36.6 % (ref 36.0–46.0)
Hemoglobin: 11 g/dL — ABNORMAL LOW (ref 12.0–15.0)

## 2023-12-09 LAB — CBC WITH DIFFERENTIAL/PLATELET
Abs Immature Granulocytes: 0.02 10*3/uL (ref 0.00–0.07)
Basophils Absolute: 0.1 10*3/uL (ref 0.0–0.1)
Basophils Relative: 1 %
Eosinophils Absolute: 0.3 10*3/uL (ref 0.0–0.5)
Eosinophils Relative: 4 %
HCT: 38.3 % (ref 36.0–46.0)
Hemoglobin: 11.8 g/dL — ABNORMAL LOW (ref 12.0–15.0)
Immature Granulocytes: 0 %
Lymphocytes Relative: 30 %
Lymphs Abs: 2.4 10*3/uL (ref 0.7–4.0)
MCH: 28.4 pg (ref 26.0–34.0)
MCHC: 30.8 g/dL (ref 30.0–36.0)
MCV: 92.1 fL (ref 80.0–100.0)
Monocytes Absolute: 0.6 10*3/uL (ref 0.1–1.0)
Monocytes Relative: 8 %
Neutro Abs: 4.6 10*3/uL (ref 1.7–7.7)
Neutrophils Relative %: 57 %
Platelets: 224 10*3/uL (ref 150–400)
RBC: 4.16 MIL/uL (ref 3.87–5.11)
RDW: 14.6 % (ref 11.5–15.5)
WBC: 8 10*3/uL (ref 4.0–10.5)
nRBC: 0 % (ref 0.0–0.2)

## 2023-12-09 LAB — BASIC METABOLIC PANEL WITH GFR
Anion gap: 8 (ref 5–15)
BUN: 26 mg/dL — ABNORMAL HIGH (ref 8–23)
CO2: 30 mmol/L (ref 22–32)
Calcium: 9.2 mg/dL (ref 8.9–10.3)
Chloride: 102 mmol/L (ref 98–111)
Creatinine, Ser: 0.88 mg/dL (ref 0.44–1.00)
GFR, Estimated: >60 mL/min (ref >60)
Glucose, Bld: 94 mg/dL (ref 70–99)
Potassium: 4.5 mmol/L (ref 3.5–5.1)
Sodium: 140 mmol/L (ref 135–145)

## 2023-12-09 LAB — TYPE AND SCREEN
ABO/RH(D): A POS
Antibody Screen: NEGATIVE

## 2023-12-09 LAB — ABO/RH: ABO/RH(D): A POS

## 2023-12-09 LAB — PROTIME-INR
INR: 1 (ref 0.8–1.2)
Prothrombin Time: 13.8 s (ref 11.4–15.2)

## 2023-12-09 SURGERY — NEPHROLITHOTOMY PERCUTANEOUS
Anesthesia: General | Laterality: Left

## 2023-12-09 MED ORDER — TRANEXAMIC ACID-NACL 1000-0.7 MG/100ML-% IV SOLN
INTRAVENOUS | Status: DC | PRN
Start: 2023-12-09 — End: 2023-12-09
  Administered 2023-12-09: 1000 mg via INTRAVENOUS

## 2023-12-09 MED ORDER — DIVALPROEX SODIUM 125 MG PO DR TAB
125.0000 mg | DELAYED_RELEASE_TABLET | Freq: Three times a day (TID) | ORAL | Status: DC
  Administered 2023-12-09 – 2023-12-10 (×3): 125 mg via ORAL
  Filled 2023-12-09 (×3): qty 1

## 2023-12-09 MED ORDER — FENTANYL CITRATE (PF) 100 MCG/2ML IJ SOLN
INTRAMUSCULAR | Status: AC
Start: 2023-12-09 — End: ?
  Filled 2023-12-09: qty 4

## 2023-12-09 MED ORDER — DONEPEZIL HCL 5 MG PO TABS
5.0000 mg | ORAL_TABLET | Freq: Every day | ORAL | Status: DC
  Administered 2023-12-09: 5 mg via ORAL
  Filled 2023-12-09: qty 1

## 2023-12-09 MED ORDER — FENTANYL CITRATE (PF) 100 MCG/2ML IJ SOLN
INTRAMUSCULAR | Status: AC
  Filled 2023-12-09: qty 2

## 2023-12-09 MED ORDER — OXYBUTYNIN CHLORIDE 5 MG PO TABS: 5.0000 mg | ORAL_TABLET | Freq: Three times a day (TID) | ORAL | Status: DC | PRN

## 2023-12-09 MED ORDER — IOHEXOL 300 MG/ML  SOLN
INTRAMUSCULAR | Status: DC | PRN
Start: 2023-12-09 — End: 2023-12-09
  Administered 2023-12-09: 20 mL

## 2023-12-09 MED ORDER — HYDROMORPHONE HCL 1 MG/ML IJ SOLN
INTRAMUSCULAR | Status: AC
  Filled 2023-12-09: qty 1

## 2023-12-09 MED ORDER — DEXAMETHASONE SODIUM PHOSPHATE 10 MG/ML IJ SOLN
INTRAMUSCULAR | Status: AC
  Filled 2023-12-09: qty 1

## 2023-12-09 MED ORDER — SUGAMMADEX SODIUM 200 MG/2ML IV SOLN
INTRAVENOUS | Status: DC | PRN
  Administered 2023-12-09: 200 mg via INTRAVENOUS

## 2023-12-09 MED ORDER — LIDOCAINE-EPINEPHRINE 1 %-1:100000 IJ SOLN
20.0000 mL | Freq: Once | INTRAMUSCULAR | Status: AC
  Administered 2023-12-09: 20 mL via INTRADERMAL

## 2023-12-09 MED ORDER — PHENYLEPHRINE 80 MCG/ML (10ML) SYRINGE FOR IV PUSH (FOR BLOOD PRESSURE SUPPORT)
PREFILLED_SYRINGE | INTRAVENOUS | Status: AC
  Filled 2023-12-09: qty 10

## 2023-12-09 MED ORDER — ORAL CARE MOUTH RINSE: 15.0000 mL | OROMUCOSAL | Status: DC | PRN

## 2023-12-09 MED ORDER — LIDOCAINE HCL (PF) 2 % IJ SOLN
INTRAMUSCULAR | Status: DC | PRN
  Administered 2023-12-09: 100 mg via INTRADERMAL

## 2023-12-09 MED ORDER — OXYCODONE HCL 5 MG/5ML PO SOLN: 5.0000 mg | Freq: Once | ORAL | Status: DC | PRN

## 2023-12-09 MED ORDER — ONDANSETRON HCL 4 MG/2ML IJ SOLN: 4.0000 mg | Freq: Once | INTRAMUSCULAR | Status: DC | PRN

## 2023-12-09 MED ORDER — LIDOCAINE-EPINEPHRINE 1 %-1:100000 IJ SOLN
INTRAMUSCULAR | Status: AC
  Filled 2023-12-09: qty 1

## 2023-12-09 MED ORDER — PROPOFOL 10 MG/ML IV BOLUS
INTRAVENOUS | Status: AC
  Filled 2023-12-09: qty 20

## 2023-12-09 MED ORDER — ZOLPIDEM TARTRATE 5 MG PO TABS: 5.0000 mg | ORAL_TABLET | Freq: Every evening | ORAL | Status: DC | PRN

## 2023-12-09 MED ORDER — PHENYLEPHRINE 80 MCG/ML (10ML) SYRINGE FOR IV PUSH (FOR BLOOD PRESSURE SUPPORT)
PREFILLED_SYRINGE | INTRAVENOUS | Status: DC | PRN
  Administered 2023-12-09 (×2): 80 ug via INTRAVENOUS

## 2023-12-09 MED ORDER — MEMANTINE HCL 10 MG PO TABS
10.0000 mg | ORAL_TABLET | Freq: Two times a day (BID) | ORAL | Status: DC
Start: 2023-12-09 — End: 2023-12-10
  Administered 2023-12-09 – 2023-12-10 (×3): 10 mg via ORAL
  Filled 2023-12-09 (×3): qty 1

## 2023-12-09 MED ORDER — SODIUM CHLORIDE 0.9 % IR SOLN
Status: DC | PRN
  Administered 2023-12-09: 3000 mL
  Administered 2023-12-09: 6000 mL
  Administered 2023-12-09: 3000 mL

## 2023-12-09 MED ORDER — ROCURONIUM BROMIDE 100 MG/10ML IV SOLN
INTRAVENOUS | Status: DC | PRN
  Administered 2023-12-09: 80 mg via INTRAVENOUS

## 2023-12-09 MED ORDER — TRAMADOL HCL 50 MG PO TABS: 50.0000 mg | ORAL_TABLET | Freq: Three times a day (TID) | ORAL | 0 refills | Status: AC | PRN

## 2023-12-09 MED ORDER — ONDANSETRON HCL 4 MG/2ML IJ SOLN
INTRAMUSCULAR | Status: AC
  Filled 2023-12-09: qty 2

## 2023-12-09 MED ORDER — ONDANSETRON HCL 4 MG/2ML IJ SOLN
INTRAMUSCULAR | Status: DC | PRN
  Administered 2023-12-09: 4 mg via INTRAVENOUS

## 2023-12-09 MED ORDER — DIPHENHYDRAMINE HCL 12.5 MG/5ML PO ELIX: 12.5000 mg | ORAL_SOLUTION | Freq: Four times a day (QID) | ORAL | Status: DC | PRN

## 2023-12-09 MED ORDER — AMISULPRIDE (ANTIEMETIC) 5 MG/2ML IV SOLN: 10.0000 mg | Freq: Once | INTRAVENOUS | Status: DC | PRN

## 2023-12-09 MED ORDER — PHENYLEPHRINE HCL (PRESSORS) 10 MG/ML IV SOLN
INTRAVENOUS | Status: AC
  Filled 2023-12-09: qty 1

## 2023-12-09 MED ORDER — 0.9 % SODIUM CHLORIDE (POUR BTL) OPTIME
TOPICAL | Status: DC | PRN
  Administered 2023-12-09: 1000 mL

## 2023-12-09 MED ORDER — CHLORHEXIDINE GLUCONATE 0.12 % MT SOLN
15.0000 mL | Freq: Once | OROMUCOSAL | Status: AC
  Administered 2023-12-09: 15 mL via OROMUCOSAL

## 2023-12-09 MED ORDER — ORAL CARE MOUTH RINSE: 15.0000 mL | Freq: Once | OROMUCOSAL | Status: AC

## 2023-12-09 MED ORDER — BACLOFEN 10 MG PO TABS
10.0000 mg | ORAL_TABLET | Freq: Every day | ORAL | Status: DC
Start: 2023-12-09 — End: 2023-12-10
  Administered 2023-12-09: 10 mg via ORAL
  Filled 2023-12-09: qty 1

## 2023-12-09 MED ORDER — HYDROMORPHONE HCL 1 MG/ML IJ SOLN
0.2500 mg | INTRAMUSCULAR | Status: DC | PRN
  Administered 2023-12-09: 0.25 mg via INTRAVENOUS

## 2023-12-09 MED ORDER — PROPOFOL 10 MG/ML IV BOLUS
INTRAVENOUS | Status: DC | PRN
  Administered 2023-12-09: 150 mg via INTRAVENOUS

## 2023-12-09 MED ORDER — DOCUSATE SODIUM 100 MG PO CAPS
100.0000 mg | ORAL_CAPSULE | Freq: Two times a day (BID) | ORAL | Status: DC
  Administered 2023-12-09 – 2023-12-10 (×3): 100 mg via ORAL
  Filled 2023-12-09 (×3): qty 1

## 2023-12-09 MED ORDER — DULOXETINE HCL 60 MG PO CPEP
60.0000 mg | ORAL_CAPSULE | Freq: Every day | ORAL | Status: DC
  Administered 2023-12-09 – 2023-12-10 (×2): 60 mg via ORAL
  Filled 2023-12-09 (×2): qty 1

## 2023-12-09 MED ORDER — SODIUM CHLORIDE 0.9 % IV SOLN: INTRAVENOUS | Status: DC

## 2023-12-09 MED ORDER — ROCURONIUM BROMIDE 10 MG/ML (PF) SYRINGE
PREFILLED_SYRINGE | INTRAVENOUS | Status: AC
  Filled 2023-12-09: qty 10

## 2023-12-09 MED ORDER — MELATONIN 5 MG PO TABS
10.0000 mg | ORAL_TABLET | Freq: Every day | ORAL | Status: DC
  Administered 2023-12-09: 10 mg via ORAL
  Filled 2023-12-09: qty 2

## 2023-12-09 MED ORDER — SODIUM CHLORIDE 0.9 % IV SOLN
2.0000 g | INTRAVENOUS | Status: AC
  Administered 2023-12-09: 2 g via INTRAVENOUS

## 2023-12-09 MED ORDER — DIPHENHYDRAMINE HCL 50 MG/ML IJ SOLN: 12.5000 mg | Freq: Four times a day (QID) | INTRAMUSCULAR | Status: DC | PRN

## 2023-12-09 MED ORDER — OXYCODONE HCL 5 MG PO TABS
5.0000 mg | ORAL_TABLET | ORAL | Status: DC | PRN
  Administered 2023-12-09 – 2023-12-10 (×2): 5 mg via ORAL
  Filled 2023-12-09 (×2): qty 1

## 2023-12-09 MED ORDER — FENTANYL CITRATE (PF) 100 MCG/2ML IJ SOLN: INTRAMUSCULAR | Status: DC | PRN

## 2023-12-09 MED ORDER — ACETAMINOPHEN 325 MG PO TABS: 650.0000 mg | ORAL_TABLET | ORAL | Status: DC | PRN

## 2023-12-09 MED ORDER — IOHEXOL 300 MG/ML  SOLN
50.0000 mL | Freq: Once | INTRAMUSCULAR | Status: AC | PRN
  Administered 2023-12-09: 15 mL

## 2023-12-09 MED ORDER — MORPHINE SULFATE (PF) 2 MG/ML IV SOLN
2.0000 mg | INTRAVENOUS | Status: DC | PRN
  Administered 2023-12-09: 2 mg via INTRAVENOUS
  Filled 2023-12-09: qty 1

## 2023-12-09 MED ORDER — MIDAZOLAM HCL 2 MG/2ML IJ SOLN
INTRAMUSCULAR | Status: AC
Start: 2023-12-09 — End: ?
  Filled 2023-12-09: qty 2

## 2023-12-09 MED ORDER — SENNA 8.6 MG PO TABS
1.0000 | ORAL_TABLET | Freq: Two times a day (BID) | ORAL | Status: DC
  Administered 2023-12-09 – 2023-12-10 (×3): 8.6 mg via ORAL
  Filled 2023-12-09 (×3): qty 1

## 2023-12-09 MED ORDER — ACETAMINOPHEN 10 MG/ML IV SOLN: 1000.0000 mg | Freq: Once | INTRAVENOUS | Status: DC | PRN

## 2023-12-09 MED ORDER — OXYCODONE HCL 5 MG PO TABS: 5.0000 mg | ORAL_TABLET | Freq: Once | ORAL | Status: DC | PRN

## 2023-12-09 MED ORDER — LEVOTHYROXINE SODIUM 150 MCG PO TABS
150.0000 ug | ORAL_TABLET | Freq: Every day | ORAL | Status: DC
  Administered 2023-12-10: 150 ug via ORAL
  Filled 2023-12-09: qty 1
  Filled 2023-12-09: qty 3

## 2023-12-09 MED ORDER — CEFTRIAXONE SODIUM 2 G IJ SOLR
2.0000 g | Freq: Once | INTRAMUSCULAR | Status: DC
  Filled 2023-12-09: qty 20

## 2023-12-09 MED ORDER — MIDAZOLAM HCL 2 MG/2ML IJ SOLN
INTRAMUSCULAR | Status: AC | PRN
  Administered 2023-12-09: 1 mg via INTRAVENOUS

## 2023-12-09 MED ORDER — FENTANYL CITRATE (PF) 100 MCG/2ML IJ SOLN
INTRAMUSCULAR | Status: DC | PRN
  Administered 2023-12-09 (×2): 50 ug via INTRAVENOUS

## 2023-12-09 MED ORDER — LACTATED RINGERS IV SOLN
INTRAVENOUS | Status: DC | PRN
Start: 2023-12-09 — End: 2023-12-09

## 2023-12-09 MED ORDER — LEVETIRACETAM 500 MG PO TABS
500.0000 mg | ORAL_TABLET | Freq: Two times a day (BID) | ORAL | Status: DC
  Administered 2023-12-09 – 2023-12-10 (×3): 500 mg via ORAL
  Filled 2023-12-09 (×3): qty 1

## 2023-12-09 MED ORDER — LIDOCAINE HCL (PF) 2 % IJ SOLN
INTRAMUSCULAR | Status: AC
  Filled 2023-12-09: qty 5

## 2023-12-09 MED ORDER — MESALAMINE 1.2 G PO TBEC
1.2000 g | DELAYED_RELEASE_TABLET | Freq: Three times a day (TID) | ORAL | Status: DC
  Administered 2023-12-09 – 2023-12-10 (×3): 1.2 g via ORAL
  Filled 2023-12-09 (×4): qty 1

## 2023-12-09 MED ORDER — LORAZEPAM 0.5 MG PO TABS: 0.5000 mg | ORAL_TABLET | Freq: Two times a day (BID) | ORAL | Status: DC | PRN

## 2023-12-09 MED ORDER — FENTANYL CITRATE (PF) 100 MCG/2ML IJ SOLN
INTRAMUSCULAR | Status: AC | PRN
  Administered 2023-12-09: 50 ug via INTRAVENOUS

## 2023-12-09 MED ORDER — ATORVASTATIN CALCIUM 40 MG PO TABS
40.0000 mg | ORAL_TABLET | Freq: Every day | ORAL | Status: DC
  Administered 2023-12-09: 40 mg via ORAL
  Filled 2023-12-09: qty 1

## 2023-12-09 MED ORDER — TRANEXAMIC ACID-NACL 1000-0.7 MG/100ML-% IV SOLN
1000.0000 mg | INTRAVENOUS | Status: DC
Start: 2023-12-09 — End: 2023-12-09
  Filled 2023-12-09: qty 100

## 2023-12-09 MED ORDER — IOHEXOL 300 MG/ML  SOLN
50.0000 mL | Freq: Once | INTRAMUSCULAR | Status: AC | PRN
Start: 2023-12-09 — End: 2023-12-09
  Administered 2023-12-09: 50 mL via INTRAVENOUS

## 2023-12-09 MED ORDER — DEXAMETHASONE SODIUM PHOSPHATE 10 MG/ML IJ SOLN
INTRAMUSCULAR | Status: DC | PRN
  Administered 2023-12-09: 8 mg via INTRAVENOUS

## 2023-12-09 SURGICAL SUPPLY — 44 items
BAG COUNTER SPONGE SURGICOUNT (BAG) IMPLANT
BAG URINE DRAIN 2000ML AR STRL (UROLOGICAL SUPPLIES) IMPLANT
BASKET ZERO TIP NITINOL 2.4FR (BASKET) IMPLANT
BENZOIN TINCTURE PRP APPL 2/3 (GAUZE/BANDAGES/DRESSINGS) ×1 IMPLANT
BLADE SURG 15 STRL LF DISP TIS (BLADE) ×1 IMPLANT
CATH FOLEY 2W COUNCIL 20FR 5CC (CATHETERS) IMPLANT
CATH ROBINSON RED A/P 20FR (CATHETERS) IMPLANT
CATH URETERAL DUAL LUMEN 10F (MISCELLANEOUS) ×1 IMPLANT
CATH X-FORCE N30 NEPHROSTOMY (TUBING) ×1 IMPLANT
CHLORAPREP W/TINT 26 (MISCELLANEOUS) ×1 IMPLANT
COVER BACK TABLE 60X90IN (DRAPES) ×1 IMPLANT
COVER SURGICAL LIGHT HANDLE (MISCELLANEOUS) IMPLANT
DERMABOND ADVANCED .7 DNX12 (GAUZE/BANDAGES/DRESSINGS) IMPLANT
DRAPE C-ARM 42X120 X-RAY (DRAPES) ×1 IMPLANT
DRAPE LINGEMAN PERC (DRAPES) ×1 IMPLANT
DRAPE SURG IRRIG POUCH 19X23 (DRAPES) ×1 IMPLANT
DRSG TEGADERM 8X12 (GAUZE/BANDAGES/DRESSINGS) IMPLANT
GAUZE PAD ABD 8X10 STRL (GAUZE/BANDAGES/DRESSINGS) ×2 IMPLANT
GAUZE SPONGE 4X4 12PLY STRL (GAUZE/BANDAGES/DRESSINGS) IMPLANT
GLOVE BIO SURGEON STRL SZ7.5 (GLOVE) ×1 IMPLANT
GOWN STRL REUS W/ TWL XL LVL3 (GOWN DISPOSABLE) ×1 IMPLANT
GUIDEWIRE AMPLAZ .035X145 (WIRE) ×2 IMPLANT
KIT BASIN OR (CUSTOM PROCEDURE TRAY) ×1 IMPLANT
KIT PROBE TRILOGY 3.9X350 (MISCELLANEOUS) IMPLANT
KIT TURNOVER KIT A (KITS) IMPLANT
LASER FIB FLEXIVA PULSE ID 365 (Laser) IMPLANT
LUBRICANT JELLY K Y 4OZ (MISCELLANEOUS) ×1 IMPLANT
MANIFOLD NEPTUNE II (INSTRUMENTS) ×1 IMPLANT
NS IRRIG 1000ML POUR BTL (IV SOLUTION) ×1 IMPLANT
PACK CYSTO (CUSTOM PROCEDURE TRAY) IMPLANT
SPONGE T-LAP 4X18 ~~LOC~~+RFID (SPONGE) ×1 IMPLANT
STENT ENDOURETEROTOMY 7-14 26C (STENTS) IMPLANT
STENT URET 6FRX26 CONTOUR (STENTS) IMPLANT
SUT CHROMIC 3 0 SH 27 (SUTURE) IMPLANT
SUT SILK 2 0 30 PSL (SUTURE) IMPLANT
SYR 10ML LL (SYRINGE) ×1 IMPLANT
SYR 20ML LL LF (SYRINGE) ×1 IMPLANT
TOWEL OR 17X26 10 PK STRL BLUE (TOWEL DISPOSABLE) ×1 IMPLANT
TRACTIP FLEXIVA PULS ID 200XHI (Laser) IMPLANT
TRAY FOLEY MTR SLVR 16FR STAT (SET/KITS/TRAYS/PACK) ×1 IMPLANT
TUBING CONNECTING 10 (TUBING) ×1 IMPLANT
TUBING STONE CATCHER TRILOGY (MISCELLANEOUS) IMPLANT
TUBING UROLOGY SET (TUBING) ×1 IMPLANT
WATER STERILE IRR 1000ML POUR (IV SOLUTION) ×1 IMPLANT

## 2023-12-09 NOTE — H&P (Addendum)
 CC/HPI: CC: Large left renal calculus  HPI:  10/23/2023  73 year old female with history of CVA on aspirin and Plavix. She is in a facility. Is accompanied by her daughter. Not really able to contribute to her history. Patient underwent a CT of the abdomen pelvis with and without contrast for gross hematuria on 10/16/2023. she was found to have a 16 mm left renal pelvic calculus and a 1 cm left lower pole calculus. She had a punctate right sided renal calculus. She has been having recurrent UTIs per her daughter's report.   12/09/2023 Patient presents today for left PCNL.  She has been off Plavix.    ALLERGIES: No Allergies    MEDICATIONS: Aspirin  Levothyroxine Sodium  Plavix  Acetaminophen  Aricept  Ascorbic Acid  Ativan  Atorvastatin Calcium 40 mg tablet  Baclofen  Calcium + D3  Cholecalciferol  Depakote  Duloxetine Hcl  Keppra  Lialda  Lidocaine  Melatonin  Milk Of Magnesia  Namenda  Voltaren Arthritis Pain     GU PSH: None   NON-GU PSH: Cesarean Delivery Thyroid Surgery     GU PMH: None   NON-GU PMH: Anxiety Arthritis Crohns Disease Depression Hypercholesterolemia Hypertension Hypothyroidism Seizure disorder Sleep Apnea Stroke/TIA    FAMILY HISTORY: 1 Daughter - Other 2 sons - Other Alzheimer's Disease - Runs in Family Diabetes - Father Gout - Father   SOCIAL HISTORY: Marital Status: Married Preferred Language: English; Race: White Current Smoking Status: Patient does not smoke anymore. Has not smoked since 10/11/2006.   Tobacco Use Assessment Completed: Used Tobacco in last 30 days? Drinks 1 caffeinated drink per day.    REVIEW OF SYSTEMS:    GU Review Female:   Patient denies frequent urination, hard to postpone urination, burning /pain with urination, get up at night to urinate, leakage of urine, stream starts and stops, trouble starting your stream, have to strain to urinate, and being pregnant.  Gastrointestinal (Upper):   Patient denies  nausea, vomiting, and indigestion/ heartburn.  Gastrointestinal (Lower):   Patient denies diarrhea and constipation.  Constitutional:   Patient denies fever, night sweats, weight loss, and fatigue.  Skin:   Patient denies skin rash/ lesion and itching.  Eyes:   Patient denies blurred vision and double vision.  Ears/ Nose/ Throat:   Patient denies sore throat and sinus problems.  Hematologic/Lymphatic:   Patient denies swollen glands and easy bruising.  Cardiovascular:   Patient denies leg swelling and chest pains.  Respiratory:   Patient denies cough and shortness of breath.  Endocrine:   Patient denies excessive thirst.  Musculoskeletal:   Patient denies back pain and joint pain.  Neurological:   Patient denies headaches and dizziness.  Psychologic:   Patient denies depression and anxiety.   BP (!) 172/69 (BP Location: Left Arm)   Pulse (!) 53   Temp 97.6 F (36.4 C) (Oral)   Resp 16   Ht 5\' 6"  (1.676 m)   Wt 80.5 kg   SpO2 100%   BMI 28.65 kg/m    MULTI-SYSTEM PHYSICAL EXAMINATION:    Constitutional: Well-nourished. No physical deformities. Normally developed. Good grooming.  Neck: Neck symmetrical, not swollen. Normal tracheal position.  Respiratory: No labored breathing, no use of accessory muscles.   Cardiovascular: Normal temperature, normal extremity pulses, no swelling, no varicosities.  Skin: No paleness, no jaundice, no cyanosis. No lesion, no ulcer, no rash.  Neurologic / Psychiatric: no anxiety, no agitation.   Gastrointestinal: No mass, no tenderness, no rigidity, non obese abdomen.  Eyes: Normal conjunctivae. Normal eyelids.       ASSESSMENT:      ICD-10 Details  1 GU:   Renal calculus - N20.0 Undiagnosed New Problem  2   Chronic cystitis (w/o hematuria) - N30.20 Undiagnosed New Problem   PLAN:    For PCNL I described the risks including heart attack, pulmonary embolus, death, positioning injury, pneumothorax, hydrothorax, need for chest tube, inability to  clear stone burden, renal laceration, arterial venous fistula or malformation, need for embolization of kidney, loss of kidney or renal function, need for repeat procedure, need for prolonged nephrostomy tube, ureteral avulsion.   Recommend left PCNL. Risk benefits discussed with her and her daughter. She will need to have clearance to come off the Plavix, preferably also the aspirin 81 mg but if she really needs to stay on the aspirin 81 that should be okay. If she has to stay on the Plavix, we will need to perform ureteroscopy instead.

## 2023-12-09 NOTE — Discharge Instructions (Signed)
 Discharge instructions following PCNL  Call your doctor for: Fevers greater than 100.5 Severe nausea or vomiting Increasing pain not controlled by pain medication Increasing redness or drainage from incisions Decreased urine output or a catheter is no longer draining  The number for questions is [phone].  Activity: Gradually increase activity with short frequent walks, 3-4 times a day.  Avoid strenuous activities, like sports, lawn-mowing, or heavy lifting (more than 10-15 pounds).  Wear loose, comfortable clothing that pull or kink the tube or tubes.  Do not drive while taking pain medication, or until your doctor permitts it.  Bathing and dressing changes: You should not shower for 48 hours after surgery.  Do not soak your back in a bathtub.  Drainage bag care: You may be discharged with a drainage bag around the site of your surgery.  The drainage bag should be secured such that it never pulls or loosens to prevent it from leaking.  It is important to wash her hands before and after emptying the drainage bag to help prevent the spread of infection.  The drainage bag should be emptied as needed.  When the wound stops draining or it is manageable with a dry gauze dressing, you can remove the bag.  If your tube in the back was removed, you should expect to have some leakage of fluid from the back incision.  This should slowly decrease and stop over the next couple of days.  If you have severe pain or persistent leakage, please call the number above.  Otherwise, your dressing can be changed 1-2 times daily or more if needed.  Diet: It is extremely important to drink plenty of fluids after surgery, especially water.  You may resume your regular diet, unless otherwise instructed.  Medications: May take Tylenol (acetaminophen) or ibuprofen (Advil, Motrin) as directed over-the-counter. Take any prescriptions as directed.  Follow-up appointments: Follow-up appointment will be scheduled  with Dr. Alvester Morin

## 2023-12-09 NOTE — Anesthesia Procedure Notes (Signed)
 Procedure Name: Intubation Date/Time: 12/09/2023 11:05 AM  Performed by: Micki Riley, CRNAPre-anesthesia Checklist: Patient identified, Emergency Drugs available, Suction available and Patient being monitored Patient Re-evaluated:Patient Re-evaluated prior to induction Oxygen Delivery Method: Circle System Utilized Preoxygenation: Pre-oxygenation with 100% oxygen Induction Type: IV induction Ventilation: Mask ventilation without difficulty Laryngoscope Size: Miller and 2 Grade View: Grade I Tube type: Oral Tube size: 7.0 mm Number of attempts: 1 Airway Equipment and Method: Stylet and Oral airway Placement Confirmation: ETT inserted through vocal cords under direct vision, positive ETCO2 and breath sounds checked- equal and bilateral Secured at: 21 cm Tube secured with: Tape Dental Injury: Teeth and Oropharynx as per pre-operative assessment

## 2023-12-09 NOTE — Procedures (Signed)
 Vascular and Interventional Radiology Procedure Note  Patient: Suzanne Barnett DOB: 02/22/1951 Medical Record Number: [id number] Note Date/Time: 12/09/23 11:47 AM   Performing Physician: Roanna Banning, MD Assistant(s): None  Diagnosis: L Renal calculus. Planned OR for PCNL  Procedure:  LEFT NEPHROURETERAL ACCESS LEFT ANTEROGRADE NEPHROSTOGRAM  Anesthesia: Conscious Sedation Complications: None Estimated Blood Loss: Minimal Specimens:  None  Findings:  Successful placement of a 5 F nephroureteral tube into the left kidney(s), with tip within the urinary bladder.  Plan: to OR for PCNL.  See detailed procedure note with images in PACS. The patient tolerated the procedure well without incident or complication and was returned to Recovery in stable condition.    Roanna Banning, MD [facility]   Pager. [phone] Clinic. [phone]

## 2023-12-09 NOTE — Op Note (Signed)
 Operative Note  Preoperative diagnosis:  1.  Left renal calculus  Postoperative diagnosis: 1.  Left renal calculus < 2cm  Procedure(s): 1.  Left percutaneous nephrolithotomy < 2 cm  Surgeon: Modena Slater, MD  Assistants: None  Anesthesia: General  Complications: None immediate  EBL: 100 cc  Specimens: 1.  Renal calculus  Drains/Catheters: 1.  6 x 26 double-J ureteral stent 2.  Foley catheter  Intraoperative findings: Large left renal pelvic calculus about 1.6cm, smaller lower pole calculi  Indication: 73 year old female with a large left renal calculus presents for the previously mentioned operation.  Description of procedure:  The patient was identified and consent was obtained.  The patient was taken to the operating room and placed in the supine position.  The patient was placed under general anesthesia.  Perioperative antibiotics were administered.  The patient was placed in prone position and all pressure points were padded.  Patient was prepped and draped in a standard sterile fashion and a timeout was performed.  A Super Stiff wire was advanced through the nephroureteral stent down to the bladder under fluoroscopic guidance and the nephroureteral stent was removed.  A dual-lumen ureteral catheter was advanced over the Super Stiff wire into the renal pelvis and an antegrade nephrostogram was performed.  This showed a well opacified kidney and a filling defect corresponding to the stone of interest.  I advanced the dual-lumen ureteral catheter into the proximal ureter under fluoroscopic guidance followed by placement of a second Super Stiff wire down to the bladder under fluoroscopic guidance.  The dual-lumen catheter was removed.  An incision was made alongside the wires.  The balloon dilator was then advanced over one of the wires and into the renal pelvis fluoroscopic guidance and the tract was dilated to a pressure of 18.  The sheath was advanced over the balloon and into  the renal pelvis.  The balloon was withdrawn keeping the sheath in place.  The nephroscope was advanced into the kidney and the stone of interest was encountered.  The stone was then removed with a combination of pneumatic and ultrasound with suction and graspers. All stone was removed and there was no evidence of any other stones within the kidney.  I performed complete pyeloscopy with the flexible cystoscope and did not identify any other stones.  A 6 x 26 double-J ureteral stent was advanced over 1 of the wires under fluoroscopic guidance and the wire was withdrawn.  Fluoroscopy confirmed a good coil within the bladder as well as a good coil in the renal pelvis proximally.  Other wire was withdrawn. Sheath was removed.  I closed the incision with running 3-0 Monocryl.  The patient tolerated procedure well and was stable postoperatively.  Plan: Patient will remain under observation overnight. Stent to be removed in 1 week.

## 2023-12-09 NOTE — Transfer of Care (Signed)
 Immediate Anesthesia Transfer of Care Note  Patient: Suzanne Barnett  Procedure(s) Performed: LEFT NEPHROLITHOTOMY PERCUTANEOUS (Left)  Patient Location: PACU  Anesthesia Type:General  Level of Consciousness: awake and alert   Airway & Oxygen Therapy: Patient Spontanous Breathing and Patient connected to face mask oxygen  Post-op Assessment: Report given to RN and Post -op Vital signs reviewed and stable  Post vital signs: Reviewed and stable  Last Vitals:  Vitals Value Taken Time  BP 134/75 12/09/23 1245  Temp 37.0   Pulse 56 12/09/23 1247  Resp 20 12/09/23 1247  SpO2 100 % 12/09/23 1247  Vitals shown include unfiled device data.  Last Pain:  Vitals:   12/09/23 1240  TempSrc:   PainSc: Asleep         Complications: No notable events documented.

## 2023-12-10 ENCOUNTER — Encounter (HOSPITAL_COMMUNITY): Payer: Self-pay | Admitting: Urology

## 2023-12-10 DIAGNOSIS — N200 Calculus of kidney: Secondary | ICD-10-CM | POA: Diagnosis not present

## 2023-12-10 LAB — HEMOGLOBIN AND HEMATOCRIT, BLOOD
HCT: 37.4 % (ref 36.0–46.0)
Hemoglobin: 11.8 g/dL — ABNORMAL LOW (ref 12.0–15.0)

## 2023-12-10 NOTE — Discharge Summary (Signed)
 Patient ID: Suzanne Barnett MRN: [id number] DOB/AGE: 02/17/1951 73 y.o.  Admit date: 12/09/2023 Discharge date: 12/10/2023  Primary Care Physician:  Marylen Ponto, MD  Present on Admission: Left Renal Calculus  Discharge Medications: Allergies as of 12/10/2023       Reactions   Other Rash   Tape    rash        Medication List     TAKE these medications    acetaminophen 500 MG tablet Commonly known as: TYLENOL Take 1,000 mg by mouth 2 (two) times daily.   ascorbic acid 500 MG tablet Commonly known as: VITAMIN C Take 500 mg by mouth 2 (two) times daily.   aspirin EC 81 MG tablet Take 81 mg by mouth daily. Swallow whole.   atorvastatin 40 MG tablet Commonly known as: LIPITOR Take 40 mg by mouth at bedtime.   baclofen 10 MG tablet Commonly known as: LIORESAL Take 10 mg by mouth at bedtime.   Cholecalciferol 75 MCG (3000 UT) Tabs Take 6,000 Units by mouth daily.   clopidogrel 75 MG tablet Commonly known as: PLAVIX Take 75 mg by mouth daily.   divalproex 125 MG DR tablet Commonly known as: DEPAKOTE Take 125 mg by mouth 3 (three) times daily.   donepezil 5 MG tablet Commonly known as: ARICEPT Take 1 tablet (5 mg total) by mouth at bedtime.   DULoxetine 60 MG capsule Commonly known as: CYMBALTA Take 60 mg by mouth daily.   levETIRAcetam 500 MG tablet Commonly known as: KEPPRA Take 500 mg by mouth 2 (two) times daily.   levothyroxine 150 MCG tablet Commonly known as: SYNTHROID Take 150 mcg by mouth daily before breakfast.   lidocaine 4 % Place 1 patch onto the skin daily as needed (Shoulder pain). ever   LORazepam 0.5 MG tablet Commonly known as: ATIVAN Take 0.5 mg by mouth every 12 (twelve) hours as needed for anxiety.   magnesium hydroxide 400 MG/5ML suspension Commonly known as: MILK OF MAGNESIA Take 45 mLs by mouth daily as needed for mild constipation or moderate constipation.   Melatonin 5 MG Caps Take 10 mg by mouth at bedtime.    memantine 10 MG tablet Commonly known as: NAMENDA Take 10 mg by mouth 2 (two) times daily.   mesalamine 1.2 g EC tablet Commonly known as: LIALDA Take 1.2 g by mouth 3 (three) times daily.   PRESCRIPTION MEDICATION at bedtime. Snack according to diet   traMADol 50 MG tablet Commonly known as: ULTRAM Take 1 tablet (50 mg total) by mouth every 8 (eight) hours as needed for moderate pain (pain score 4-6).   Voltaren 1 % Gel Generic drug: diclofenac Sodium Apply 2 g topically 3 (three) times daily. Additional of needed every 6 hours for pain       Significant Diagnostic Studies:  IR URETERAL STENT LEFT NEW ACCESS W/O SEP NEPHROSTOMY CATH Result Date: 12/09/2023 INDICATION: Renal stones, access for LEFT percutaneous nephrolithotomy. LT PCN FOR OR TO FOLLOW EXAM: Procedures; 1. LEFT ANTEGRADE NEPHROSTOGRAM 2. LEFT NEPHROURETERAL CATHETER FOR NEPHROLITHOTOMY ACCESS COMPARISON:  [facility] CT AP, 10/16/2023. MEDICATIONS: Rocephin 2 gm IV; The antibiotic was administered in an appropriate time frame prior to skin puncture. ANESTHESIA/SEDATION: Moderate (conscious) sedation was employed during this procedure. A total of Versed 1 mg and Fentanyl 50 mcg was administered intravenously. Moderate Sedation Time: 16 minutes. The patient's level of consciousness and vital signs were monitored continuously by radiology nursing throughout the procedure under my direct supervision. CONTRAST:  50mL OMNIPAQUE IOHEXOL  300 MG/ML SOLN, 15mL OMNIPAQUE IOHEXOL 300 MG/ML SOLN - Administered into the renal collecting system. FLUOROSCOPY TIME:  Fluoroscopic dose; 6 mGy COMPLICATIONS: None immediate. PROCEDURE: Informed written consent was obtained from the patient after a discussion of the risks, benefits, and alternatives to treatment. The LEFT flank region was prepped with sterile prep in a usual fashion, and a sterile drape was applied covering the operative field. A sterile gown and sterile gloves were used for the  procedure. A timeout was performed prior to the initiation of the procedure. A pre procedural spot fluoroscopic image was obtained of the upper abdomen. Ultrasound scanning performed of the kidney was negative for significant hydronephrosis. As such, the stone within inferior pole was targeted using a combination of ultrasound and fluoroscopically with a 22 gauge Chiba needle. Access to the collecting system was confirmed with advancement of a Nitrex wire into the collecting system. The needle was exchanged for the inner 3 Fr catheter from an Accustick set and contrast injection confirmed access. An Accustick set was utilized to dilate the tract and was subsequently exchanged for a Kumpe catheter over a Bentson wire. The Kumpe catheter was advanced down the ureter and into the urinary bladder. Postprocedural spot radiographs were obtained in various obliquities and the catheter was sutured to the skin. The catheter was capped and a dressing was placed. The patient tolerated the procedure well without immediate post procedural complication. FINDINGS: *Pre procedural spot radiographic images demonstrates stone burden at the LEFT inferior renal collecting system and renal pelvis. *Ultrasound scanning demonstrated decompressed LEFT renal collecting system without hydronephrosis. *Antegrade nephrostogram demonstrating a partially-obstructive LEFT renal calculus. IMPRESSION: Successful placement of a LEFT 5 Fr nephroureteral catheter, with tip at the level of the urinary bladder for access during nephrolithotomy. PLAN: To OR for percutaneous nephrolithotomy with urology, Dr. Modena Slater III . Roanna Banning, MD [facility] Electronically Signed   By: Roanna Banning M.D.   On: 12/09/2023 17:33   DG C-Arm 1-60 Min-No Report Result Date: 12/09/2023 Fluoroscopy was utilized by the requesting physician.  No radiographic interpretation.   DG C-Arm 1-60 Min-No  Report Result Date: 12/09/2023 Fluoroscopy was utilized by the requesting physician.  No radiographic interpretation.    Brief H and P: For complete details please refer to admission H and P, but in brief, pt is a 73y female w/ hx of CVA, limited language lexicon of a few words, who resides in full time care facility in [place]. She presented to [facility] hospital for scheduled PCNL for treatment of 16mm left renal calculus with Dr. Alvester Morin on 3/31/325. Procedure was completed without difficulty and pt was admitted overnight for monitoring and pain control.  She was accompanied by her daughter. She was able to communicate that she did not have any pain. Access site C/D/I. They are comfortable discharging home and have appt scheduled for stent removal and follow up next week.    Hospital Course:  Principal Problem:   Renal calculi   Day of Discharge BP 129/66 (BP Location: Left Arm)   Pulse 68   Temp 98.2 F (36.8 C) (Oral)   Resp 18   Ht 5\' 6"  (1.676 m)   Wt 80.5 kg   SpO2 96%   BMI 28.65 kg/m   Results for orders placed or performed during the hospital encounter of 12/09/23 (from the past 24 hours)  Hemoglobin and hematocrit, blood     Status: Abnormal   Collection Time: 12/09/23 12:53 PM  Result Value Ref Range   Hemoglobin 11.0 (L) 12.0 - 15.0 g/dL   HCT 16.1 09.6 - 04.5 %  Hemoglobin and hematocrit, blood     Status: Abnormal   Collection Time: 12/10/23  4:01 AM  Result Value Ref Range   Hemoglobin 11.8 (L) 12.0 - 15.0 g/dL   HCT 40.9 81.1 - 91.4 %     Physical Exam Vitals reviewed.  Constitutional:      Appearance: Normal appearance.  HENT:     Head: Normocephalic and atraumatic.  Cardiovascular:     Comments: No cyanosis Abdominal:     General: Abdomen is flat.  Genitourinary:    Comments: Foley draining clear yellow urine Skin:    General: Skin is warm and dry.  Neurological:     Mental Status: She is alert. Mental status is at baseline.  Psychiatric:         Mood and Affect: Mood normal.        Behavior: Behavior normal.    Disposition:  Back to facility  Diet:  Regular  Activity:  as tolerated   DISCHARGE FOLLOW-UP  Scheduled to see Dr. Alvester Morin on 12/16/23 @ 2:30p for stent removal and reassessment.   Time spent on Discharge:   A total of 37 minutes was spent in planning, discharge, and education.   SignedScherrie Bateman Jourdan Maldonado 12/10/2023, 8:36 AM

## 2023-12-10 NOTE — TOC Transition Note (Signed)
 Transition of Care [facility]) - Discharge Note  Patient Details  Name: Suzanne Barnett MRN: [id number] Date of Birth: 10-01-1950  [facility] CM/SW Contact:  Ewing Schlein, LCSW Phone Number: 12/10/2023, 9:30 AM  Clinical Narrative: [facility] is a resident of [facility] CSW followed up with the facility regarding discharge needs. CSW notified by Lanora Manis with the facility that a new FL2 will not be needed as patient is expected to be under observation less than 24 hours, so only discharge paperwork will be required. Discharge summary, discharge orders, and SNF transfer report faxed to facility in hub. CSW updated daughter, Capucine Tryon. Facility's transportation to pick up patient. TOC signing off.  Final next level of care: Skilled Nursing Facility Barriers to Discharge: No Barriers Identified  Patient Goals and CMS Choice Choice offered to / list presented to : NA  Discharge Placement      Patient chooses bed at: Other - please specify in the comment section below: ([facility]) Patient to be transferred to facility by: Facility transportation Name of family member notified: Vyla Pint (daughter) Patient and family notified of of transfer: 12/10/23  Discharge Plan and Services Additional resources added to the After Visit Summary for          DME Arranged: N/A DME Agency: NA  Social Drivers of Health (SDOH) Interventions SDOH Screenings   Food Insecurity: Patient Unable To Answer (12/09/2023)  Housing: Patient Unable To Answer (12/09/2023)  Transportation Needs: Patient Unable To Answer (12/09/2023)  Utilities: Patient Unable To Answer (12/09/2023)  Social Connections: Patient Unable To Answer (12/09/2023)  Tobacco Use: Low Risk  (12/09/2023)   Readmission Risk Interventions     No data to display

## 2023-12-10 NOTE — Anesthesia Postprocedure Evaluation (Signed)
 Anesthesia Post Note  Patient: Suzanne Barnett  Procedure(s) Performed: LEFT NEPHROLITHOTOMY PERCUTANEOUS (Left)     Patient location during evaluation: PACU Anesthesia Type: General Level of consciousness: awake and alert Pain management: pain level controlled Vital Signs Assessment: post-procedure vital signs reviewed and stable Respiratory status: spontaneous breathing, nonlabored ventilation, respiratory function stable and patient connected to nasal cannula oxygen Cardiovascular status: blood pressure returned to baseline and stable Postop Assessment: no apparent nausea or vomiting Anesthetic complications: no  No notable events documented.  Last Vitals:  Vitals:   12/10/23 0111 12/10/23 0437  BP: 128/71 129/66  Pulse: 72 68  Resp: 18 18  Temp: 36.9 C 36.8 C  SpO2: 97% 96%    Last Pain:  Vitals:   12/10/23 0437  TempSrc: Oral  PainSc:                  Trevor Iha

## 2024-06-16 ENCOUNTER — Telehealth: Payer: Self-pay | Admitting: Gastroenterology

## 2024-06-16 NOTE — Telephone Encounter (Signed)
 Request received to transfer GI care from outside practice to [facility].  We appreciate the interest in our practice, however due to high demand from patients without established GI providers, we cannot accommodate this transfer.   Patient has a GI practice in [facility] and was also seen by GI during the recent hospital stay at the [facility].  - H. Danis

## 2024-06-16 NOTE — Telephone Encounter (Signed)
 Good Morning Dr Legrand Daub MD AM   Patient family requesting transfer for patient to be seen for GI bleed.   Patient had a previous anesthesia event with [facility] in September. Please review and advise on scheduling.   Thank you
